# Patient Record
Sex: Female | Born: 1950 | Race: Black or African American | Hispanic: No | Marital: Married | Smoking: Former smoker
Health system: Southern US, Community
[De-identification: ages and names within clinical notes are randomized; demographics above are authoritative.]

## PROBLEM LIST (undated history)

## (undated) DIAGNOSIS — I10 Essential (primary) hypertension: Secondary | ICD-10-CM

## (undated) DIAGNOSIS — E119 Type 2 diabetes mellitus without complications: Secondary | ICD-10-CM

## (undated) HISTORY — PX: ABDOMINAL HYSTERECTOMY: SHX81

---

## 2013-12-28 ENCOUNTER — Other Ambulatory Visit (HOSPITAL_COMMUNITY): Payer: Self-pay | Admitting: Family Medicine

## 2013-12-28 ENCOUNTER — Ambulatory Visit (HOSPITAL_COMMUNITY)
Admission: RE | Admit: 2013-12-28 | Discharge: 2013-12-28 | Disposition: A | Discharge status: Home / Self Care | Payer: Self-pay | Source: Ambulatory Visit | Attending: Family Medicine | Admitting: Family Medicine

## 2013-12-28 DIAGNOSIS — R52 Pain, unspecified: Secondary | ICD-10-CM

## 2017-12-06 ENCOUNTER — Emergency Department (HOSPITAL_BASED_OUTPATIENT_CLINIC_OR_DEPARTMENT_OTHER): Payer: Medicare Other

## 2017-12-06 ENCOUNTER — Encounter (HOSPITAL_BASED_OUTPATIENT_CLINIC_OR_DEPARTMENT_OTHER): Payer: Self-pay | Admitting: Emergency Medicine

## 2017-12-06 ENCOUNTER — Other Ambulatory Visit: Payer: Self-pay

## 2017-12-06 ENCOUNTER — Inpatient Hospital Stay (HOSPITAL_BASED_OUTPATIENT_CLINIC_OR_DEPARTMENT_OTHER)
Admission: EM | Admit: 2017-12-06 | Discharge: 2017-12-08 | DRG: 871 | Disposition: A | Discharge status: Home / Self Care | Payer: Medicare Other | Attending: Family Medicine | Admitting: Family Medicine

## 2017-12-06 DIAGNOSIS — Z7982 Long term (current) use of aspirin: Secondary | ICD-10-CM | POA: Diagnosis not present

## 2017-12-06 DIAGNOSIS — Z87891 Personal history of nicotine dependence: Secondary | ICD-10-CM

## 2017-12-06 DIAGNOSIS — J189 Pneumonia, unspecified organism: Secondary | ICD-10-CM | POA: Diagnosis present

## 2017-12-06 DIAGNOSIS — I10 Essential (primary) hypertension: Secondary | ICD-10-CM | POA: Diagnosis present

## 2017-12-06 DIAGNOSIS — E1169 Type 2 diabetes mellitus with other specified complication: Secondary | ICD-10-CM | POA: Diagnosis present

## 2017-12-06 DIAGNOSIS — E876 Hypokalemia: Secondary | ICD-10-CM | POA: Diagnosis present

## 2017-12-06 DIAGNOSIS — A419 Sepsis, unspecified organism: Secondary | ICD-10-CM | POA: Diagnosis present

## 2017-12-06 DIAGNOSIS — Z7984 Long term (current) use of oral hypoglycemic drugs: Secondary | ICD-10-CM | POA: Diagnosis not present

## 2017-12-06 DIAGNOSIS — Z79899 Other long term (current) drug therapy: Secondary | ICD-10-CM

## 2017-12-06 HISTORY — DX: Type 2 diabetes mellitus without complications: E11.9

## 2017-12-06 LAB — COMPREHENSIVE METABOLIC PANEL
ALBUMIN: 3.4 g/dL — AB (ref 3.5–5.0)
ALT: 12 U/L — ABNORMAL LOW (ref 14–54)
ANION GAP: 11 (ref 5–15)
AST: 16 U/L (ref 15–41)
Alkaline Phosphatase: 96 U/L (ref 38–126)
BUN: 8 mg/dL (ref 6–20)
CHLORIDE: 100 mmol/L — AB (ref 101–111)
CO2: 24 mmol/L (ref 22–32)
Calcium: 8.5 mg/dL — ABNORMAL LOW (ref 8.9–10.3)
Creatinine, Ser: 0.64 mg/dL (ref 0.44–1.00)
GFR calc Af Amer: >60 mL/min (ref >60)
Glucose, Bld: 167 mg/dL — ABNORMAL HIGH (ref 65–99)
POTASSIUM: 2.9 mmol/L — AB (ref 3.5–5.1)
Sodium: 135 mmol/L (ref 135–145)
TOTAL PROTEIN: 7.9 g/dL (ref 6.5–8.1)
Total Bilirubin: 0.3 mg/dL (ref 0.3–1.2)

## 2017-12-06 LAB — CBC WITH DIFFERENTIAL/PLATELET
Basophils Absolute: 0 10*3/uL (ref 0.0–0.1)
Basophils Relative: 0 %
EOS ABS: 0.1 10*3/uL (ref 0.0–0.7)
Eosinophils Relative: 1 %
HEMATOCRIT: 36.4 % (ref 36.0–46.0)
Hemoglobin: 11.8 g/dL — ABNORMAL LOW (ref 12.0–15.0)
LYMPHS ABS: 2.7 10*3/uL (ref 0.7–4.0)
Lymphocytes Relative: 22 %
MCH: 26.6 pg (ref 26.0–34.0)
MCHC: 32.4 g/dL (ref 30.0–36.0)
MCV: 82 fL (ref 78.0–100.0)
MONO ABS: 1.5 10*3/uL — AB (ref 0.1–1.0)
Monocytes Relative: 12 %
Neutro Abs: 8 10*3/uL — ABNORMAL HIGH (ref 1.7–7.7)
Neutrophils Relative %: 65 %
PLATELETS: 418 10*3/uL — AB (ref 150–400)
RBC: 4.44 MIL/uL (ref 3.87–5.11)
RDW: 13.9 % (ref 11.5–15.5)
WBC: 12.3 10*3/uL — ABNORMAL HIGH (ref 4.0–10.5)

## 2017-12-06 LAB — EXPECTORATED SPUTUM ASSESSMENT W REFEX TO RESP CULTURE

## 2017-12-06 LAB — HEMOGLOBIN A1C
Hgb A1c MFr Bld: 8.6 % — ABNORMAL HIGH (ref 4.8–5.6)
MEAN PLASMA GLUCOSE: 200.12 mg/dL

## 2017-12-06 LAB — INFLUENZA PANEL BY PCR (TYPE A & B)
INFLAPCR: NEGATIVE
Influenza B By PCR: NEGATIVE

## 2017-12-06 LAB — GLUCOSE, CAPILLARY: Glucose-Capillary: 169 mg/dL — ABNORMAL HIGH (ref 65–99)

## 2017-12-06 LAB — I-STAT CG4 LACTIC ACID, ED
LACTIC ACID, VENOUS: 1.07 mmol/L (ref 0.5–1.9)
LACTIC ACID, VENOUS: 1.14 mmol/L (ref 0.5–1.9)

## 2017-12-06 LAB — EXPECTORATED SPUTUM ASSESSMENT W GRAM STAIN, RFLX TO RESP C

## 2017-12-06 MED ORDER — INSULIN ASPART 100 UNIT/ML ~~LOC~~ SOLN
0.0000 [IU] | Freq: Three times a day (TID) | SUBCUTANEOUS | Status: DC
  Administered 2017-12-07: 3 [IU] via SUBCUTANEOUS
  Administered 2017-12-07: 2 [IU] via SUBCUTANEOUS
  Administered 2017-12-07 – 2017-12-08 (×2): 5 [IU] via SUBCUTANEOUS
  Administered 2017-12-08: 3 [IU] via SUBCUTANEOUS

## 2017-12-06 MED ORDER — KETOROLAC TROMETHAMINE 15 MG/ML IJ SOLN
15.0000 mg | Freq: Four times a day (QID) | INTRAMUSCULAR | Status: DC | PRN
  Administered 2017-12-06 – 2017-12-08 (×3): 15 mg via INTRAVENOUS
  Filled 2017-12-06 (×3): qty 1

## 2017-12-06 MED ORDER — LOSARTAN POTASSIUM 50 MG PO TABS
100.0000 mg | ORAL_TABLET | Freq: Every day | ORAL | Status: DC
  Administered 2017-12-06 – 2017-12-08 (×3): 100 mg via ORAL
  Filled 2017-12-06 (×3): qty 2

## 2017-12-06 MED ORDER — SODIUM CHLORIDE 0.9 % IV BOLUS
1000.0000 mL | Freq: Once | INTRAVENOUS | Status: AC
  Administered 2017-12-06: 1000 mL via INTRAVENOUS

## 2017-12-06 MED ORDER — ENOXAPARIN SODIUM 40 MG/0.4ML ~~LOC~~ SOLN
40.0000 mg | SUBCUTANEOUS | Status: DC
  Administered 2017-12-06 – 2017-12-07 (×2): 40 mg via SUBCUTANEOUS
  Filled 2017-12-06 (×2): qty 0.4

## 2017-12-06 MED ORDER — SODIUM CHLORIDE 0.9 % IV SOLN
500.0000 mg | INTRAVENOUS | Status: DC
  Administered 2017-12-07 – 2017-12-08 (×2): 500 mg via INTRAVENOUS
  Filled 2017-12-06 (×2): qty 500

## 2017-12-06 MED ORDER — POTASSIUM CHLORIDE CRYS ER 20 MEQ PO TBCR
40.0000 meq | EXTENDED_RELEASE_TABLET | Freq: Once | ORAL | Status: AC
  Administered 2017-12-06: 40 meq via ORAL
  Filled 2017-12-06: qty 2

## 2017-12-06 MED ORDER — POTASSIUM CHLORIDE CRYS ER 20 MEQ PO TBCR
40.0000 meq | EXTENDED_RELEASE_TABLET | Freq: Two times a day (BID) | ORAL | Status: DC
  Administered 2017-12-07 – 2017-12-08 (×3): 40 meq via ORAL
  Filled 2017-12-06 (×3): qty 2

## 2017-12-06 MED ORDER — INSULIN ASPART 100 UNIT/ML ~~LOC~~ SOLN: 0.0000 [IU] | Freq: Every day | SUBCUTANEOUS | Status: DC

## 2017-12-06 MED ORDER — ALBUTEROL SULFATE HFA 108 (90 BASE) MCG/ACT IN AERS
2.0000 | INHALATION_SPRAY | Freq: Once | RESPIRATORY_TRACT | Status: AC
  Administered 2017-12-06: 2 via RESPIRATORY_TRACT
  Filled 2017-12-06: qty 6.7

## 2017-12-06 MED ORDER — HYDRALAZINE HCL 20 MG/ML IJ SOLN: 5.0000 mg | INTRAMUSCULAR | Status: DC | PRN

## 2017-12-06 MED ORDER — SODIUM CHLORIDE 0.9 % IV SOLN
INTRAVENOUS | Status: DC
  Administered 2017-12-06 – 2017-12-07 (×3): via INTRAVENOUS

## 2017-12-06 MED ORDER — IPRATROPIUM-ALBUTEROL 0.5-2.5 (3) MG/3ML IN SOLN
3.0000 mL | Freq: Once | RESPIRATORY_TRACT | Status: AC
  Administered 2017-12-06: 3 mL via RESPIRATORY_TRACT
  Filled 2017-12-06: qty 3

## 2017-12-06 MED ORDER — BENZONATATE 100 MG PO CAPS
100.0000 mg | ORAL_CAPSULE | Freq: Three times a day (TID) | ORAL | Status: DC | PRN
  Administered 2017-12-06 – 2017-12-08 (×4): 100 mg via ORAL
  Filled 2017-12-06 (×4): qty 1

## 2017-12-06 MED ORDER — CEFTRIAXONE SODIUM 1 G IJ SOLR: 1.0000 g | INTRAMUSCULAR | Status: DC

## 2017-12-06 MED ORDER — LEVOFLOXACIN 750 MG PO TABS
750.0000 mg | ORAL_TABLET | Freq: Once | ORAL | Status: AC
  Administered 2017-12-06: 750 mg via ORAL
  Filled 2017-12-06: qty 1

## 2017-12-06 MED ORDER — SODIUM CHLORIDE 0.9 % IV SOLN
1.0000 g | INTRAVENOUS | Status: DC
  Administered 2017-12-06 – 2017-12-07 (×2): 1 g via INTRAVENOUS
  Filled 2017-12-06: qty 10
  Filled 2017-12-06 (×2): qty 1

## 2017-12-06 MED ORDER — ACETAMINOPHEN 650 MG RE SUPP: 650.0000 mg | Freq: Four times a day (QID) | RECTAL | Status: DC | PRN

## 2017-12-06 MED ORDER — ACETAMINOPHEN 325 MG PO TABS: 650.0000 mg | ORAL_TABLET | Freq: Four times a day (QID) | ORAL | Status: DC | PRN

## 2017-12-06 NOTE — ED Provider Notes (Signed)
[facility] EMERGENCY DEPARTMENT Provider Note   CSN: [id number] Arrival date & time: 12/06/17  0913     History   Chief Complaint Chief Complaint  Patient presents with  . Cough    HPI Floretta Petro is a 67 y.o. female.  The history is provided by the patient and medical records. No language interpreter was used.  Cough  This is a new problem. The current episode started more than 1 week ago. The problem occurs constantly. The problem has been rapidly worsening. The cough is productive of sputum. There has been no fever. Associated symptoms include chills, rhinorrhea, shortness of breath and wheezing. Pertinent negatives include no chest pain, no sweats and no headaches. She has tried nothing for the symptoms. The treatment provided no relief. Her past medical history does not include COPD or asthma.    Past Medical History:  Diagnosis Date  . Diabetes mellitus without complication (HCC)     There are no active problems to display for this patient.   Past Surgical History:  Procedure Laterality Date  . ABDOMINAL HYSTERECTOMY       OB History   None      Home Medications    Prior to Admission medications   Medication Sig Start Date End Date Taking? Authorizing Provider  glipiZIDE (GLUCOTROL) 10 MG tablet Take 10 mg by mouth daily before breakfast.   Yes [provider]  metFORMIN (GLUCOPHAGE) 1000 MG tablet Take 1,000 mg by mouth 2 (two) times daily with a meal.   Yes [provider]    Family History History reviewed. No pertinent family history.  Social History Social History   Tobacco Use  . Smoking status: Former Games developer  . Smokeless tobacco: Never Used  Substance Use Topics  . Alcohol use: Never    Frequency: Never  . Drug use: Never     Allergies   Patient has no known allergies.   Review of Systems Review of Systems  Constitutional: Positive for chills and fatigue. Negative for diaphoresis and fever.  HENT:  Positive for congestion and rhinorrhea.   Respiratory: Positive for cough, chest tightness, shortness of breath and wheezing. Negative for stridor.   Cardiovascular: Negative for chest pain and palpitations.  Gastrointestinal: Negative for constipation, diarrhea and nausea.  Genitourinary: Negative for dysuria.  Musculoskeletal: Negative for back pain.  Neurological: Negative for light-headedness and headaches.  Psychiatric/Behavioral: Negative for agitation.  All other systems reviewed and are negative.    Physical Exam Updated Vital Signs BP (!) 169/79 (BP Location: Left Arm)   Pulse 98   Temp 98.7 F (37.1 C) (Oral)   Resp 20   Ht 5\' 5"  (1.651 m)   Wt 81.6 kg (180 lb)   SpO2 92%   BMI 29.95 kg/m   Physical Exam  Constitutional: She is oriented to person, place, and time. She appears well-developed and well-nourished. No distress.  HENT:  Head: Normocephalic.  Nose: Nose normal.  Mouth/Throat: Oropharynx is clear and moist. No oropharyngeal exudate.  Eyes: Pupils are equal, round, and reactive to light. Conjunctivae and EOM are normal.  Neck: Normal range of motion.  Cardiovascular: Intact distal pulses. Tachycardia present.  No murmur heard. Pulmonary/Chest: Tachypnea noted. No respiratory distress. She has wheezes. She has rhonchi. She has no rales.  Abdominal: Soft. She exhibits no distension. There is no tenderness.  Musculoskeletal: She exhibits no edema, tenderness or deformity.  Neurological: She is alert and oriented to person, place, and time. No sensory deficit. She  exhibits normal muscle tone.  Skin: Capillary refill takes less than 2 seconds. No rash noted. She is not diaphoretic. No erythema.  Psychiatric: She has a normal mood and affect.  Nursing note and vitals reviewed.    ED Treatments / Results  Labs (all labs ordered are listed, but only abnormal results are displayed) Labs Reviewed  CBC WITH DIFFERENTIAL/PLATELET - Abnormal; Notable for the  following components:      Result Value   WBC 12.3 (*)    Hemoglobin 11.8 (*)    Platelets 418 (*)    Neutro Abs 8.0 (*)    Monocytes Absolute 1.5 (*)    All other components within normal limits  COMPREHENSIVE METABOLIC PANEL - Abnormal; Notable for the following components:   Potassium 2.9 (*)    Chloride 100 (*)    Glucose, Bld 167 (*)    Calcium 8.5 (*)    Albumin 3.4 (*)    ALT 12 (*)    All other components within normal limits  CULTURE, BLOOD (ROUTINE X 2)  CULTURE, BLOOD (ROUTINE X 2)  HIV ANTIBODY (ROUTINE TESTING)  I-STAT CG4 LACTIC ACID, ED  I-STAT CG4 LACTIC ACID, ED    EKG EKG Interpretation  Date/Time:  Monday December 06 2017 09:55:02 EDT Ventricular Rate:  100 PR Interval:    QRS Duration: 84 QT Interval:  363 QTC Calculation: 469 R Axis:   39 Text Interpretation:  Sinus tachycardia Baseline wander in lead(s) V5 No prior ECG for comparison.  No STEMI Confirmed by Theda Belfastegeler, Chris ([id number]) on 12/06/2017 10:17:22 AM   Radiology Dg Chest 2 View  Result Date: 12/06/2017 CLINICAL DATA:  Short of breath, cough EXAM: CHEST - 2 VIEW COMPARISON:  CT chest 11/16/2004 FINDINGS: Patchy bilateral airspace disease right greater than left, probable pneumonia. No effusion. Heart size normal. Mediastinal structures normal. Negative skeletal structures. IMPRESSION: Patchy bilateral airspace disease most likely bronchopneumonia. Electronically Signed   By: Marlan Palauharles  Clark M.D.   On: 12/06/2017 10:11    Procedures Procedures (including critical care time)  Medications Ordered in ED Medications  enoxaparin (LOVENOX) injection 40 mg (has no administration in time range)  ipratropium-albuterol (DUONEB) 0.5-2.5 (3) MG/3ML nebulizer solution 3 mL (3 mLs Nebulization Given 12/06/17 0945)  albuterol (PROVENTIL HFA;VENTOLIN HFA) 108 (90 Base) MCG/ACT inhaler 2 puff (2 puffs Inhalation Given 12/06/17 1106)  levofloxacin (LEVAQUIN) tablet 750 mg (750 mg Oral Given 12/06/17 1105)  sodium chloride  0.9 % bolus 1,000 mL (0 mLs Intravenous Stopped 12/06/17 1330)  potassium chloride SA (K-DUR,KLOR-CON) CR tablet 40 mEq (40 mEq Oral Given 12/06/17 1349)     Initial Impression / Assessment and Plan / ED Course  I have reviewed the triage vital signs and the nursing notes.  Pertinent labs & imaging results that were available during my care of the patient were reviewed by me and considered in my medical decision making (see chart for details).     Theresia MajorsVivian Diiorio is a 67 y.o. female with a past medical history significant for diabetes who presents with rhinorrhea, cough, congestion, chills, and shortness of breath.  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  She reports that for the last 2 weeks she has had a cough that has become more productive over the last few days.  She reports it was green and now a yellow sputum.  No hemoptysis.  She reports some chills but has had no documented fevers  at home.  She denies nausea vomiting, urinary symptoms or GI symptoms.  She reports that she is having some shortness of breath and chest tightness but denies chest pressure.  She denies any history of pneumonia.    On exam, patient had coarse breath sounds bilaterally.  Patient had very faint wheezes.  No history of asthma or COPD.  Patient was given a breathing treatment with some improvement in the wheezing.  Patient's chest was nontender and abdomen was nontender.  Legs were nonedematous.  Patient was tachycardic on initial evaluation.   Workup revealed evidence of bronchopneumonia as well as a leukocytosis.  Potassium was low and was submitted orally.  Lactic acid not elevated.  EKG showed no evidence of STEMI.   Initially, patient was given oral levofloxacin to treat the pneumonia as she was felt possibly able to be discharged home.  When patient was ambulate, she became more tachycardic and short of breath and did not feel well.  She is  concerned about safely going home to treat her pneumonia as an outpatient.  When patient was resting her oxygen saturations decreased into the 90% range.  Patient was placed on 2 L with significant improvement in her oxygen saturations and breathing.    Based on patient's pneumonia with the oxygen requirement, patient will be admitted to hospital service for further management.  Patient was given potassium for the low potassium.  Hospitalist team was called and patient be admitted for further management.   Final Clinical Impressions(s) / ED Diagnoses   Final diagnoses:  Community acquired pneumonia, unspecified laterality    ED Discharge Orders    None      Clinical Impression: 1. Community acquired pneumonia, unspecified laterality     Disposition: Admit  This note was prepared with assistance of Conservation officer, historic buildings. Occasional wrong-word or sound-a-like substitutions may have occurred due to the inherent limitations of voice recognition software.     Tegeler, Canary Brim, MD 12/06/17 1538

## 2017-12-06 NOTE — Plan of Care (Signed)
67 yo [facility] with cough and sob chest xray cw pneumonia.was tachypneic and tachycardic on arrival.90%on RA.received levofloxacin.k was found to be 2.9.given 80 meq k.needs admission for pneumonia.

## 2017-12-06 NOTE — ED Triage Notes (Signed)
Patient states that she has had an intermittent cough over the last 2 weeks. Headache and Sore throat

## 2017-12-06 NOTE — ED Notes (Signed)
PT ambulated with pulse oximetry. HR 114-118 pt endorses dizziness. Sats 92-95%

## 2017-12-06 NOTE — H&P (Addendum)
History and Physical    Jasmine Frye UJW:[id number] DOB: September 26, 1950 DOA: 12/06/2017  PCP: Anselmo Pickler, MD  Patient coming from: Home  Chief Complaint: Cough  HPI: Jasmine Frye is a 67 y.o. female with medical history significant of DM presents with worsening productive cough and sob over the past several days prior to admit. 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 Over past several days, patient reported increased cough, seemed to be worse at night, wakening pt's husband. Cough later became productive of thick mucus. Patient then noted increased SOB and subsequently presented to [facility] ED   ED Course: In the Ed, patient noted to be tachycardic with WBC of over 12k and increased tachypnea. Patient has been continued on [facility]. CXR was notable for patchy bilateral airspace disease. Patient was given one dose of levaquin. Hospitalist consulted for consideration for admission. Of note, since receiving one dose of abx, patient has noted feeling somewhat improved.  Review of Systems:  Review of Systems  Constitutional: Negative for chills, malaise/fatigue and weight loss.  HENT: Negative for ear discharge, ear pain, nosebleeds and tinnitus.   Eyes: Negative for double vision, photophobia and pain.  Respiratory: Positive for cough, sputum production and shortness of breath. Negative for stridor.   Cardiovascular: Negative for orthopnea, claudication and leg swelling.  Gastrointestinal: Negative for abdominal pain, constipation, nausea and vomiting.  Genitourinary: Negative for frequency, hematuria and urgency.  Musculoskeletal: Negative for back pain, joint pain and neck pain.  Neurological: Negative for tingling, tremors, seizures, loss of consciousness and headaches.  Psychiatric/Behavioral: Negative for hallucinations. The patient is not nervous/anxious and does not have insomnia.     Past Medical History:  Diagnosis Date  . Diabetes mellitus  without complication [facility])     Past Surgical History:  Procedure Laterality Date  . ABDOMINAL HYSTERECTOMY       reports that she has quit smoking. She has never used smokeless tobacco. She reports that she does not drink alcohol or use drugs.  No Known Allergies  Family history: Multiple members with diabetes Mother deceased from heart disease Multiple members with HTN  Prior to Admission medications   Medication Sig Start Date End Date Taking? Authorizing Provider  Aspirin-Acetaminophen (GOODYS BODY PAIN PO) Take 1 packet by mouth 2 (two) times daily.   Yes [provider]  Camphor-Eucalyptus-Menthol (VICKS VAPORUB) 4.7-1.2-2.6 % OINT Apply topically.   Yes [provider]  Chlorphen-Phenyleph-ASA (ALKA-SELTZER PLUS COLD PO) Take 1 tablet by mouth daily as needed.   Yes [provider]  diphenhydrAMINE (BENADRYL) 25 mg capsule Take 25 mg by mouth every 6 (six) hours as needed.   Yes [provider]  glipiZIDE (GLUCOTROL) 10 MG tablet Take 10 mg by mouth daily before breakfast.   Yes [provider]  Phenylephrine-DM-GG-APAP (MUCINEX FAST-MAX COLD FLU) 5-10-200-325 MG/10ML LIQD Take 10 mLs by mouth 2 (two) times daily as needed.   Yes [provider]  pioglitazone (ACTOS) 30 MG tablet Take 30 mg by mouth daily. 10/30/17  Yes [provider]  sitaGLIPtin-metformin (JANUMET) 50-1000 MG tablet Take 1 tablet by mouth 2 (two) times daily with a meal.   Yes [provider]  losartan (COZAAR) 100 MG tablet Take 100 mg by mouth daily.    [provider]    Physical Exam: Vitals:   12/06/17 1337 12/06/17 1545 12/06/17 1619 12/06/17 1726  BP: (!) 142/97  (!) 157/72 (!) 163/75  Pulse:  100 99 (!) 101  Resp:  (!) 25 20 20   Temp:   98.4 F (36.9 C) 99.5 F (37.5 C)  TempSrc:   Oral Oral  SpO2:  98% 96% 99%  Weight:    82.5 kg (181 lb 14.1 oz)  Height:    5' 5.5" (1.664 m)    Constitutional: NAD, calm,  comfortable Vitals:   12/06/17 1337 12/06/17 1545 12/06/17 1619 12/06/17 1726  BP: (!) 142/97  (!) 157/72 (!) 163/75  Pulse:  100 99 (!) 101  Resp:  (!) 25 20 20   Temp:   98.4 F (36.9 C) 99.5 F (37.5 C)  TempSrc:   Oral Oral  SpO2:  98% 96% 99%  Weight:    82.5 kg (181 lb 14.1 oz)  Height:    5' 5.5" (1.664 m)   Eyes: PERRL, lids and conjunctivae normal ENMT: Mucous membranes are moist. Posterior pharynx clear of any exudate or lesions.Normal dentition.  Neck: normal, supple, no masses, no thyromegaly Respiratory: clear to auscultation bilaterally, no wheezing, normal resp effort Cardiovascular: Regular rate and rhythm, s1, s2 Abdomen: no tenderness, no masses palpated. No hepatosplenomegaly. Bowel sounds positive.  Musculoskeletal: no clubbing / cyanosis. No joint deformity upper and lower extremities. Good ROM, no contractures. Normal muscle tone.  Skin: no rashes, lesions, ulcers. No induration Neurologic: CN 2-12 grossly intact. strength 5/5 in all 4.  Psychiatric: Normal judgment and insight. Alert and oriented x 3. Normal mood.    Labs on Admission: I have personally reviewed following labs and imaging studies  CBC: Recent Labs  Lab 12/06/17 1221  WBC 12.3*  NEUTROABS 8.0*  HGB 11.8*  HCT 36.4  MCV 82.0  PLT 418*   Basic Metabolic Panel: Recent Labs  Lab 12/06/17 1221  NA 135  K 2.9*  CL 100*  CO2 24  GLUCOSE 167*  BUN 8  CREATININE 0.64  CALCIUM 8.5*   GFR: Estimated Creatinine Clearance: 75.1 mL/min (by C-G formula based on SCr of 0.64 mg/dL). Liver Function Tests: Recent Labs  Lab 12/06/17 1221  AST 16  ALT 12*  ALKPHOS 96  BILITOT 0.3  PROT 7.9  ALBUMIN 3.4*   No results for input(s): LIPASE, AMYLASE in the last 168 hours. No results for input(s): AMMONIA in the last 168 hours. Coagulation Profile: No results for input(s): INR, PROTIME in the last 168 hours. Cardiac Enzymes: No results for input(s): CKTOTAL, CKMB, CKMBINDEX, TROPONINI  in the last 168 hours. BNP (last 3 results) No results for input(s): PROBNP in the last 8760 hours. HbA1C: No results for input(s): HGBA1C in the last 72 hours. CBG: No results for input(s): GLUCAP in the last 168 hours. Lipid Profile: No results for input(s): CHOL, HDL, LDLCALC, TRIG, CHOLHDL, LDLDIRECT in the last 72 hours. Thyroid Function Tests: No results for input(s): TSH, T4TOTAL, FREET4, T3FREE, THYROIDAB in the last 72 hours. Anemia Panel: No results for input(s): VITAMINB12, FOLATE, FERRITIN, TIBC, IRON, RETICCTPCT in the last 72 hours. Urine analysis: No results found for: COLORURINE, APPEARANCEUR, LABSPEC, PHURINE, GLUCOSEU, HGBUR, BILIRUBINUR, KETONESUR, PROTEINUR, UROBILINOGEN, NITRITE, LEUKOCYTESUR Sepsis Labs: !!!!!!!!!!!!!!!!!!!!!!!!!!!!!!!!!!!!!!!!!!!! @LABRCNTIP (procalcitonin:4,lacticidven:4) )No results found for this or any previous visit (from the past 240 hour(s)).   Radiological Exams on Admission: Dg Chest 2 View  Result Date: 12/06/2017 CLINICAL DATA:  Short of breath, cough EXAM: CHEST - 2 VIEW COMPARISON:  CT chest 11/16/2004 FINDINGS: Patchy bilateral airspace disease right greater than left, probable pneumonia. No effusion. Heart size normal. Mediastinal structures normal. Negative skeletal structures. IMPRESSION: Patchy bilateral airspace disease most likely  bronchopneumonia. Electronically Signed   By: Marlan Palauharles  Clark M.D.   On: 12/06/2017 10:11    EKG: Independently reviewed. Sinus tach  Assessment/Plan Principal Problem:   Sepsis due to pneumonia [facility](HCC) Active Problems:   Hypokalemia   Type 2 diabetes mellitus with other specified complication (HCC)   1. Sepsis with pneumonia 1. Pt presents with tachycardia, tachypnea, leukocytosis in the setting of PNA on CXR. 2. Sepsis protocol initiated 3. Given one dose of levofloxacin. Will continue azithro with rocephin 4. Follow blood and sputum cultures 5. Will check flu swab given multiple sick  contacts 6. Repeat CBC and bmet in AM 2. DM2 1. Random glucose stable 2. Will hold oral diabetic medications 3. Continue on SSI coverage 4. Will check A1c 3. Hypokalemia 1. 40meq given once in ED for K of 2.9 2. Normal renal function 3. Will give additional 40meq later for total of 80meq 4. Repeat bmet in AM. 4. HTN 1. BP currently suboptimally controlled 2. Resume home ARB as tolerated 3. Will order PRN hydralazine for sbp>160  DVT prophylaxis: Lovenox subq  Code Status: Full Family Communication: Pt in room, family not at bedside  Disposition Plan: Possible home in 48-72hrs  Consults called:  Admission status: Inpatient as would likely require greater than 2 midnight stay to resolve sepsis and hypoxemia in setting of sepsis with pneumonia   Rickey BarbaraStephen Chiu MD [facility] Pager [phone]  If 7PM-7AM, please contact night-coverage [email or web address] Password [email or web address]  12/06/2017, 5:54 PM

## 2017-12-06 NOTE — Plan of Care (Signed)
  Problem: Pain Managment: Goal: General experience of comfort will improve Outcome: Progressing   Problem: Skin Integrity: Goal: Risk for impaired skin integrity will decrease Outcome: Progressing   Problem: Activity: Goal: Ability to tolerate increased activity will improve Outcome: Progressing   Problem: Clinical Measurements: Goal: Ability to maintain a body temperature in the normal range will improve Outcome: Progressing   Problem: Respiratory: Goal: Ability to maintain adequate ventilation will improve Outcome: Progressing Goal: Ability to maintain a clear airway will improve Outcome: Progressing   

## 2017-12-06 NOTE — ED Notes (Addendum)
Patient placed on 2L via [place] per MD due to oxygen saturation of 91% on RA. Oxygen saturation 97% on 2L [place]

## 2017-12-07 LAB — COMPREHENSIVE METABOLIC PANEL
ALBUMIN: 2.8 g/dL — AB (ref 3.5–5.0)
ALT: 12 U/L — ABNORMAL LOW (ref 14–54)
AST: 19 U/L (ref 15–41)
Alkaline Phosphatase: 89 U/L (ref 38–126)
Anion gap: 7 (ref 5–15)
BUN: 5 mg/dL — ABNORMAL LOW (ref 6–20)
CHLORIDE: 103 mmol/L (ref 101–111)
CO2: 25 mmol/L (ref 22–32)
Calcium: 8.3 mg/dL — ABNORMAL LOW (ref 8.9–10.3)
Creatinine, Ser: 0.59 mg/dL (ref 0.44–1.00)
GFR calc Af Amer: >60 mL/min (ref >60)
GFR calc non Af Amer: >60 mL/min (ref >60)
GLUCOSE: 184 mg/dL — AB (ref 65–99)
POTASSIUM: 4.6 mmol/L (ref 3.5–5.1)
SODIUM: 135 mmol/L (ref 135–145)
Total Bilirubin: 0.8 mg/dL (ref 0.3–1.2)
Total Protein: 7 g/dL (ref 6.5–8.1)

## 2017-12-07 LAB — GLUCOSE, CAPILLARY
GLUCOSE-CAPILLARY: 159 mg/dL — AB (ref 65–99)
Glucose-Capillary: 231 mg/dL — ABNORMAL HIGH (ref 65–99)
Glucose-Capillary: 150 mg/dL — ABNORMAL HIGH (ref 65–99)
Glucose-Capillary: 189 mg/dL — ABNORMAL HIGH (ref 65–99)

## 2017-12-07 LAB — CBC
HCT: 35.2 % — ABNORMAL LOW (ref 36.0–46.0)
Hemoglobin: 11.4 g/dL — ABNORMAL LOW (ref 12.0–15.0)
MCH: 27.3 pg (ref 26.0–34.0)
MCHC: 32.4 g/dL (ref 30.0–36.0)
MCV: 84.4 fL (ref 78.0–100.0)
Platelets: 421 10*3/uL — ABNORMAL HIGH (ref 150–400)
RBC: 4.17 MIL/uL (ref 3.87–5.11)
RDW: 14 % (ref 11.5–15.5)
WBC: 10.6 10*3/uL — ABNORMAL HIGH (ref 4.0–10.5)

## 2017-12-07 LAB — STREP PNEUMONIAE URINARY ANTIGEN: Strep Pneumo Urinary Antigen: NEGATIVE

## 2017-12-07 LAB — HIV ANTIBODY (ROUTINE TESTING W REFLEX): HIV Screen 4th Generation wRfx: NONREACTIVE

## 2017-12-07 MED ORDER — HYDROCOD POLST-CPM POLST ER 10-8 MG/5ML PO SUER
5.0000 mL | Freq: Two times a day (BID) | ORAL | Status: DC | PRN
  Administered 2017-12-07: 5 mL via ORAL
  Filled 2017-12-07: qty 5

## 2017-12-07 MED ORDER — HYDROCOD POLST-CPM POLST ER 10-8 MG/5ML PO SUER: 5.0000 mL | Freq: Two times a day (BID) | ORAL | Status: DC | PRN

## 2017-12-07 NOTE — Plan of Care (Signed)
  Problem: Pain Managment: Goal: General experience of comfort will improve Outcome: Progressing   Problem: Skin Integrity: Goal: Risk for impaired skin integrity will decrease Outcome: Progressing   Problem: Activity: Goal: Ability to tolerate increased activity will improve Outcome: Progressing   Problem: Clinical Measurements: Goal: Ability to maintain a body temperature in the normal range will improve Outcome: Progressing   Problem: Respiratory: Goal: Ability to maintain adequate ventilation will improve Outcome: Progressing Goal: Ability to maintain a clear airway will improve Outcome: Progressing

## 2017-12-07 NOTE — Progress Notes (Signed)
PROGRESS NOTE    Jasmine Frye  UEA:[id number] DOB: [birth year] DOA: 12/06/2017 PCP: Anselmo Pickler, MD    Brief Narrative:  67 y.o. female with medical history significant of DM presents with worsening productive cough and sob over the past several days prior to admit. 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 Over past several days, patient reported increased cough, seemed to be worse at night, wakening pt's husband. Cough later became productive of thick mucus. Patient then noted increased SOB and subsequently presented to [facility] ED   In ED, patient was found to be septic with patchy bilateral airspace disease. Patient was admitted for further work up.  Assessment & Plan:   Principal Problem:   Sepsis due to pneumonia [facility]) Active Problems:   Hypokalemia   Type 2 diabetes mellitus with other specified complication (HCC)  1. Sepsis with pneumonia 1. Pt presents with tachycardia, tachypnea, leukocytosis in the setting of PNA on CXR. 2. Sepsis protocol initiated 3. Given one dose of levofloxacin in the ED and has been continued on azithro with rocephin 4. Patient reporting improvement, however still with sob and increased cough 5. WBC improving. Patient afebrile. No longer septic 6. Will give trial of flutter valve 7. Repeat CBC in AM 2. DM2 1. Random glucose stable 2. Will hold oral diabetic medications while in hospital 3. Continue on SSI coverage 4. A1c of 8.6 3. Hypokalemia 1. given once in ED for K of 2.9 with additional given 2. Normal renal function 3. Potassium corrected 4. Repeat bmet in AM 4. HTN 1. BP currently suboptimally controlled 2. Have resumed home ARB as tolerated 3. Continue PRN hydralazine for sbp>160   DVT prophylaxis: Lovenox subQ Code Status: Full Family Communication: Pt in room, family not at bedside Disposition Plan: Uncertain at this time  Consultants:     Procedures:      Antimicrobials: Anti-infectives (From admission, onward)   Start     Dose/Rate Route Frequency Ordered Stop   12/07/17 1000  cefTRIAXone (ROCEPHIN) 1 g in sodium chloride 0.9 % 100 mL IVPB  Status:  Discontinued     1 g 200 mL/hr over 30 Minutes Intravenous Every 24 hours 12/06/17 1748 12/06/17 1826   12/07/17 1000  azithromycin (ZITHROMAX) 500 mg in sodium chloride 0.9 % 250 mL IVPB     500 mg 250 mL/hr over 60 Minutes Intravenous Every 24 hours 12/06/17 1748 12/14/17 0959   12/06/17 2000  cefTRIAXone (ROCEPHIN) 1 g in sodium chloride 0.9 % 100 mL IVPB     1 g 200 mL/hr over 30 Minutes Intravenous Every 24 hours 12/06/17 1826 12/13/17 1959   12/06/17 1100  levofloxacin (LEVAQUIN) tablet 750 mg     750 mg Oral  Once 12/06/17 1045 12/06/17 1105       Subjective: Feels better, however still sob and with increased productive cough  Objective: Vitals:   12/06/17 1619 12/06/17 1726 12/06/17 2206 12/07/17 0617  BP: (!) 157/72 (!) 163/75 (!) 163/74 (!) 168/86  Pulse: 99 (!) 101 87 85  Resp: 20 20 18 16   Temp: 98.4 F (36.9 C) 99.5 F (37.5 C) 98.8 F (37.1 C) 97.7 F (36.5 C)  TempSrc: Oral Oral Oral Oral  SpO2: 96% 99% 97% 98%  Weight:  82.5 kg (181 lb 14.1 oz)    Height:  5' 5.5" (1.664 m)      Intake/Output Summary (Last 24 hours) at 12/07/2017 1424 Last data filed at 12/07/2017 4782  Gross per 24 hour  Intake 1685 ml  Output 2300 ml  Net -615 ml   Filed Weights   12/06/17 0928 12/06/17 1726  Weight: 81.6 kg (180 lb) 82.5 kg (181 lb 14.1 oz)    Examination:  General exam: Appears calm and comfortable  Respiratory system: Coarse breath sounds on auscultation. slighly increased resp effort Cardiovascular system: S1 & S2 heard, regular Gastrointestinal system: Abdomen is nondistended, soft and nontender. No organomegaly or masses felt. Normal bowel sounds heard. Central nervous system: Alert and oriented. No focal neurological deficits. Extremities: Symmetric 5 x  5 power. Skin: No rashes, lesions  Psychiatry: Judgement and insight appear normal. Mood & affect appropriate.   Data Reviewed: I have personally reviewed following labs and imaging studies  CBC: Recent Labs  Lab 12/06/17 1221 12/07/17 0643  WBC 12.3* 10.6*  NEUTROABS 8.0*  --   HGB 11.8* 11.4*  HCT 36.4 35.2*  MCV 82.0 84.4  PLT 418* 421*   Basic Metabolic Panel: Recent Labs  Lab 12/06/17 1221 12/07/17 0550  NA 135 135  K 2.9* 4.6  CL 100* 103  CO2 24 25  GLUCOSE 167* 184*  BUN 8 5*  CREATININE 0.64 0.59  CALCIUM 8.5* 8.3*   GFR: Estimated Creatinine Clearance: 75.1 mL/min (by C-G formula based on SCr of 0.59 mg/dL). Liver Function Tests: Recent Labs  Lab 12/06/17 1221 12/07/17 0550  AST 16 19  ALT 12* 12*  ALKPHOS 96 89  BILITOT 0.3 0.8  PROT 7.9 7.0  ALBUMIN 3.4* 2.8*   No results for input(s): LIPASE, AMYLASE in the last 168 hours. No results for input(s): AMMONIA in the last 168 hours. Coagulation Profile: No results for input(s): INR, PROTIME in the last 168 hours. Cardiac Enzymes: No results for input(s): CKTOTAL, CKMB, CKMBINDEX, TROPONINI in the last 168 hours. BNP (last 3 results) No results for input(s): PROBNP in the last 8760 hours. HbA1C: Recent Labs    12/06/17 1845  HGBA1C 8.6*   CBG: Recent Labs  Lab 12/06/17 2200 12/07/17 0724 12/07/17 1147  GLUCAP 169* 231* 150*   Lipid Profile: No results for input(s): CHOL, HDL, LDLCALC, TRIG, CHOLHDL, LDLDIRECT in the last 72 hours. Thyroid Function Tests: No results for input(s): TSH, T4TOTAL, FREET4, T3FREE, THYROIDAB in the last 72 hours. Anemia Panel: No results for input(s): VITAMINB12, FOLATE, FERRITIN, TIBC, IRON, RETICCTPCT in the last 72 hours. Sepsis Labs: Recent Labs  Lab 12/06/17 1220 12/06/17 1515  LATICACIDVEN 1.07 1.14    Recent Results (from the past 240 hour(s))  Blood culture (routine x 2)     Status: None (Preliminary result)   Collection Time: 12/06/17 12:15  PM  Result Value Ref Range Status   Specimen Description   Final    BLOOD BLOOD RIGHT FOREARM Performed at [facility] [place], [address] [place], [place] [address]    Special Requests   Final    BOTTLES DRAWN AEROBIC AND ANAEROBIC Blood Culture adequate volume Performed at [facility] [place], [address] [place], [place] [address]    Culture   Final    NO GROWTH < 24 HOURS Performed at [facility], [address] [place], [place] [address]    Report Status PENDING  Incomplete  Blood culture (routine x 2)     Status: None (Preliminary result)   Collection Time: 12/06/17 12:19 PM  Result Value Ref Range Status   Specimen Description   Final    BLOOD BLOOD RIGHT  HAND Performed at [facility], [address] [place], [place] [address]    Special Requests   Final    BOTTLES DRAWN AEROBIC AND ANAEROBIC Blood Culture adequate volume Performed at [facility], [address] [place], [place] [address]    Culture   Final    NO GROWTH < 24 HOURS Performed at [facility], [address], [place], [place] [address]    Report Status PENDING  Incomplete  Culture, sputum-assessment     Status: None   Collection Time: 12/06/17  9:12 PM  Result Value Ref Range Status   Specimen Description SPU  Final   Special Requests NONE  Final   Sputum evaluation   Final    THIS SPECIMEN IS ACCEPTABLE FOR SPUTUM CULTURE Performed at [facility], [address] [place], [place] [address]    Report Status 12/06/2017 FINAL  Final     Radiology Studies: Dg Chest 2 View  Result Date: 12/06/2017 CLINICAL DATA:  Short of breath, cough EXAM: CHEST - 2 VIEW COMPARISON:  CT chest 11/16/2004 FINDINGS: Patchy bilateral airspace disease right greater than left, probable pneumonia. No effusion. Heart size normal. Mediastinal structures normal. Negative skeletal structures. IMPRESSION: Patchy bilateral airspace disease most likely  bronchopneumonia. Electronically Signed   By: Marlan Palau M.D.   On: 12/06/2017 10:11    Scheduled Meds: . enoxaparin (LOVENOX) injection  40 mg Subcutaneous Q24H  . insulin aspart  0-15 Units Subcutaneous TID WC  . insulin aspart  0-5 Units Subcutaneous QHS  . losartan  100 mg Oral Daily  . potassium chloride  40 mEq Oral BID   Continuous Infusions: . sodium chloride 75 mL/hr at 12/07/17 0740  . azithromycin Stopped (12/07/17 1037)  . cefTRIAXone (ROCEPHIN)  IV Stopped (12/06/17 2134)     LOS: 1 day   Rickey Barbara, MD [facility] Pager [phone]  If 7PM-7AM, please contact night-coverage [email or web address] Password [facility] 12/07/2017, 2:24 PM

## 2017-12-07 NOTE — Care Management Note (Signed)
Case Management Note  Patient Details  Name: Jasmine Frye MRN: [id number] Date of Birth: 10/23/1950  Subjective/Objective: Admitted w/Sepsis. From home.                   Action/Plan:d/c plan home,   Expected Discharge Date:                  Expected Discharge Plan:  Home/Self Care  In-House Referral:     Discharge planning Services  CM Consult  Post Acute Care Choice:    Choice offered to:     DME Arranged:    DME Agency:     HH Arranged:    HH Agency:     Status of Service:  In process, will continue to follow  If discussed at Long Length of Stay Meetings, dates discussed:    Additional Comments:  Lanier ClamMahabir, Makaveli Hoard, RN 12/07/2017, 1:33 PM

## 2017-12-08 DIAGNOSIS — E876 Hypokalemia: Secondary | ICD-10-CM

## 2017-12-08 DIAGNOSIS — A419 Sepsis, unspecified organism: Principal | ICD-10-CM

## 2017-12-08 DIAGNOSIS — J189 Pneumonia, unspecified organism: Secondary | ICD-10-CM

## 2017-12-08 DIAGNOSIS — E1169 Type 2 diabetes mellitus with other specified complication: Secondary | ICD-10-CM

## 2017-12-08 LAB — BASIC METABOLIC PANEL
ANION GAP: 11 (ref 5–15)
BUN: 7 mg/dL (ref 6–20)
CO2: 24 mmol/L (ref 22–32)
Calcium: 8.8 mg/dL — ABNORMAL LOW (ref 8.9–10.3)
Chloride: 100 mmol/L — ABNORMAL LOW (ref 101–111)
Creatinine, Ser: 0.58 mg/dL (ref 0.44–1.00)
Glucose, Bld: 209 mg/dL — ABNORMAL HIGH (ref 65–99)
POTASSIUM: 4.3 mmol/L (ref 3.5–5.1)
Sodium: 135 mmol/L (ref 135–145)

## 2017-12-08 LAB — CBC
HEMATOCRIT: 37.4 % (ref 36.0–46.0)
HEMOGLOBIN: 11.8 g/dL — AB (ref 12.0–15.0)
MCH: 26.7 pg (ref 26.0–34.0)
MCHC: 31.6 g/dL (ref 30.0–36.0)
MCV: 84.6 fL (ref 78.0–100.0)
Platelets: 483 10*3/uL — ABNORMAL HIGH (ref 150–400)
RBC: 4.42 MIL/uL (ref 3.87–5.11)
RDW: 13.9 % (ref 11.5–15.5)
WBC: 10.6 10*3/uL — ABNORMAL HIGH (ref 4.0–10.5)

## 2017-12-08 LAB — GLUCOSE, CAPILLARY
GLUCOSE-CAPILLARY: 211 mg/dL — AB (ref 65–99)
GLUCOSE-CAPILLARY: 179 mg/dL — AB (ref 65–99)

## 2017-12-08 MED ORDER — HYDROCOD POLST-CPM POLST ER 10-8 MG/5ML PO SUER: 5.0000 mL | Freq: Two times a day (BID) | ORAL | 0 refills | Status: AC | PRN

## 2017-12-08 MED ORDER — PREDNISONE 20 MG PO TABS: 40.0000 mg | ORAL_TABLET | Freq: Every day | ORAL | 0 refills | Status: DC

## 2017-12-08 MED ORDER — CEFPODOXIME PROXETIL 200 MG PO TABS
200.0000 mg | ORAL_TABLET | Freq: Two times a day (BID) | ORAL | 0 refills | Status: AC
Start: 2017-12-08 — End: 2017-12-12

## 2017-12-08 MED ORDER — SENNOSIDES-DOCUSATE SODIUM 8.6-50 MG PO TABS: 1.0000 | ORAL_TABLET | Freq: Every day | ORAL | Status: DC

## 2017-12-08 NOTE — Discharge Summary (Signed)
Physician Discharge Summary  Leita Lindbloom  JXB:[id number]  DOB: [birth year]  DOA: 12/06/2017 PCP: Anselmo Pickler, MD  Admit date: 12/06/2017 Discharge date: 12/08/2017  Admitted From: Home  Disposition: Home  Recommendations for Outpatient Follow-up:  1. Follow up with PCP in 1 week 2. Please obtain BMP/CBC in one week to monitor renal function and white count 3. Repeat chest x-ray in 6 weeks to monitor resolution of pneumonia  Discharge Condition: Stable CODE STATUS: Full code Diet recommendation: Heart Healthy / Carb Modified   Brief/Interim Summary: For full details see H&P/Progress note, but in brief, Jasmine Frye is a 67 year old female with medical history significant of diabetes mellitus presented to the emergency department with shortness of breath and cough.  Upon ED evaluation she was found to be septic with chest x-ray showing patchy bilateral airspace disease patient was admitted for working diagnosis of pneumonia.    Subjective: Patient seen and examined, she reported that her breathing has significantly improved.  Cough much better.  Moving around well without shortness of breath.  Tolerating diet well.  Remains afebrile.  Discharge Diagnoses/Hospital Course:  Sepsis secondary to pneumonia (CAP) Sepsis physiology has resolved She initially was treated with Levaquin then switched to azithromycin and Rocephin.  She completed 3 days of azithromycin, will complete total 7 days of antibiotics with Vantin. She was treated with flutter valve, cough has improved but on exam still not moving air well, will discharge on prednisone 40 mg x 3 days.  Follow-up with PCP in 1 week. Blood cultures no growth, influenza PCR negative, strep pneumo antigen negative.  Diabetes mellitus type 2 CBGs were stable during hospital stay Resume home medication A1c 8.6 Follow-up with PCP  Hypokalemia Resolved  Hypertension BP acceptable due to hospital stay Continue home medications with  no changes Follow-up with PCP  On the day of the discharge the patient's vitals were stable, and no other acute medical condition were reported by patient. the patient was felt safe to be discharge to home.   Discharge Instructions  You were cared for by a hospitalist during your hospital stay. If you have any questions about your discharge medications or the care you received while you were in the hospital after you are discharged, you can call the unit and asked to speak with the hospitalist on call if the hospitalist that took care of you is not available. Once you are discharged, your primary care physician will handle any further medical issues. Please note that NO REFILLS for any discharge medications will be authorized once you are discharged, as it is imperative that you return to your primary care physician (or establish a relationship with a primary care physician if you do not have one) for your aftercare needs so that they can reassess your need for medications and monitor your lab values.  Discharge Instructions    Call MD for:  difficulty breathing, headache or visual disturbances   Complete by:  As directed    Call MD for:  extreme fatigue   Complete by:  As directed    Call MD for:  hives   Complete by:  As directed    Call MD for:  persistant dizziness or light-headedness   Complete by:  As directed    Call MD for:  persistant nausea and vomiting   Complete by:  As directed    Call MD for:  redness, tenderness, or signs of infection (pain, swelling, redness, odor or green/yellow discharge around incision site)   Complete  by:  As directed    Call MD for:  severe uncontrolled pain   Complete by:  As directed    Call MD for:  temperature >100.4   Complete by:  As directed    Diet - low sodium heart healthy   Complete by:  As directed    Increase activity slowly   Complete by:  As directed      Allergies as of 12/08/2017   No Known Allergies     Medication List     STOP taking these medications   diphenhydrAMINE 25 mg capsule Commonly known as:  BENADRYL   GOODYS BODY PAIN PO   MUCINEX FAST-MAX COLD FLU 5-10-200-325 MG/10ML Liqd Generic drug:  Phenylephrine-DM-GG-APAP     TAKE these medications   ALKA-SELTZER PLUS COLD PO Take 1 tablet by mouth daily as needed.   cefpodoxime 200 MG tablet Commonly known as:  VANTIN Take 1 tablet (200 mg total) by mouth 2 (two) times daily for 4 days.   chlorpheniramine-HYDROcodone 10-8 MG/5ML Suer Commonly known as:  TUSSIONEX Take 5 mLs by mouth every 12 (twelve) hours as needed for up to 3 days for cough.   glipiZIDE 10 MG tablet Commonly known as:  GLUCOTROL Take 10 mg by mouth daily before breakfast.   losartan 100 MG tablet Commonly known as:  COZAAR Take 100 mg by mouth daily.   pioglitazone 30 MG tablet Commonly known as:  ACTOS Take 30 mg by mouth daily.   predniSONE 20 MG tablet Commonly known as:  DELTASONE Take 2 tablets (40 mg total) by mouth daily with breakfast.   sitaGLIPtin-metformin 50-1000 MG tablet Commonly known as:  JANUMET Take 1 tablet by mouth 2 (two) times daily with a meal.   VICKS VAPORUB 4.7-1.2-2.6 % Oint Apply topically.      Follow-up Information    Achreja, Youlanda Mighty, MD. Schedule an appointment as soon as possible for a visit in 1 week(s).   Specialty:  Family Medicine Why:  Hospital follow-up Contact information: [address] [place] [address] [phone]          No Known Allergies  Consultations:   Procedures/Studies: Dg Chest 2 View  Result Date: 12/06/2017 CLINICAL DATA:  Short of breath, cough EXAM: CHEST - 2 VIEW COMPARISON:  CT chest 11/16/2004 FINDINGS: Patchy bilateral airspace disease right greater than left, probable pneumonia. No effusion. Heart size normal. Mediastinal structures normal. Negative skeletal structures. IMPRESSION: Patchy bilateral airspace disease most likely bronchopneumonia. Electronically Signed   By:  Marlan Palau M.D.   On: 12/06/2017 10:11     Discharge Exam: Vitals:   12/07/17 2155 12/08/17 0446  BP: (!) 160/78 (!) 149/73  Pulse: 85 97  Resp: 16 16  Temp: 98.8 F (37.1 C) 98.4 F (36.9 C)  SpO2: 98% 98%   Vitals:   12/07/17 0617 12/07/17 1535 12/07/17 2155 12/08/17 0446  BP: (!) 168/86 130/69 (!) 160/78 (!) 149/73  Pulse: 85 79 85 97  Resp: 16 17 16 16   Temp: 97.7 F (36.5 C) 99.4 F (37.4 C) 98.8 F (37.1 C) 98.4 F (36.9 C)  TempSrc: Oral Oral Oral Oral  SpO2: 98% 98% 98% 98%  Weight:      Height:        General: Pt is alert, awake, not in acute distress Cardiovascular: RRR, S1/S2 +, no rubs, no gallops Respiratory: Diffuse rhonchi, good air entry. Mild expiratory wheezing  Abdominal: Soft, NT, ND, bowel sounds + Extremities: no edema  The results of significant diagnostics from this hospitalization (including imaging, microbiology, ancillary and laboratory) are listed below for reference.     Microbiology: Recent Results (from the past 240 hour(s))  Blood culture (routine x 2)     Status: None (Preliminary result)   Collection Time: 12/06/17 12:15 PM  Result Value Ref Range Status   Specimen Description   Final    BLOOD BLOOD RIGHT FOREARM Performed at [facility], [address] [place], [place] [address]    Special Requests   Final    BOTTLES DRAWN AEROBIC AND ANAEROBIC Blood Culture adequate volume Performed at [facility], [address] [place], [place] [address]    Culture   Final    NO GROWTH 2 DAYS Performed at [facility], [address] [place], [place] [address]    Report Status PENDING  Incomplete  Blood culture (routine x 2)     Status: None (Preliminary result)   Collection Time: 12/06/17 12:19 PM  Result Value Ref Range Status   Specimen Description   Final    BLOOD BLOOD RIGHT HAND Performed at [facility], [address] [place], [place] [address]    Special Requests   Final     BOTTLES DRAWN AEROBIC AND ANAEROBIC Blood Culture adequate volume Performed at [facility], [address] [place], [place] [address]    Culture   Final    NO GROWTH 2 DAYS Performed at [facility], [address] [place], [place] [address]    Report Status PENDING  Incomplete  Culture, sputum-assessment     Status: None   Collection Time: 12/06/17  9:12 PM  Result Value Ref Range Status   Specimen Description SPU  Final   Special Requests NONE  Final   Sputum evaluation   Final    THIS SPECIMEN IS ACCEPTABLE FOR SPUTUM CULTURE Performed at [facility], [address], [place], [place] [address]    Report Status 12/06/2017 FINAL  Final  Culture, respiratory (NON-Expectorated)     Status: None (Preliminary result)   Collection Time: 12/06/17  9:12 PM  Result Value Ref Range Status   Specimen Description   Final    SPU Performed at [facility], [address], [place], [place] [address]    Special Requests   Final    NONE Reflexed from [id number] Performed at [facility], [address], [place], [place] [address]    Gram Stain   Final    CULTURE REINCUBATED FOR BETTER GROWTH Performed at [facility], [address], [place], [place] [address]    Culture PENDING  Incomplete   Report Status PENDING  Incomplete     Labs: BNP (last 3 results) No results for input(s): BNP in the last 8760 hours. Basic Metabolic Panel: Recent Labs  Lab 12/06/17 1221 12/07/17 0550 12/08/17 0456  NA 135 135 135  K 2.9* 4.6 4.3  CL 100* 103 100*  CO2 24 25 24   GLUCOSE 167* 184* 209*  BUN 8 5* 7  CREATININE 0.64 0.59 0.58  CALCIUM 8.5* 8.3* 8.8*   Liver Function Tests: Recent Labs  Lab 12/06/17 1221 12/07/17 0550  AST 16 19  ALT 12* 12*  ALKPHOS 96 89  BILITOT 0.3 0.8  PROT 7.9 7.0  ALBUMIN 3.4* 2.8*   No results for input(s): LIPASE, AMYLASE in the last 168 hours. No  results for input(s):  AMMONIA in the last 168 hours. CBC: Recent Labs  Lab 12/06/17 1221 12/07/17 0643 12/08/17 0456  WBC 12.3* 10.6* 10.6*  NEUTROABS 8.0*  --   --   HGB 11.8* 11.4* 11.8*  HCT 36.4 35.2* 37.4  MCV 82.0 84.4 84.6  PLT 418* 421* 483*   Cardiac Enzymes: No results for input(s): CKTOTAL, CKMB, CKMBINDEX, TROPONINI in the last 168 hours. BNP: Invalid input(s): POCBNP CBG: Recent Labs  Lab 12/07/17 1147 12/07/17 1621 12/07/17 2153 12/08/17 0800 12/08/17 1137  GLUCAP 150* 159* 189* 211* 179*   D-Dimer No results for input(s): DDIMER in the last 72 hours. Hgb A1c Recent Labs    12/06/17 1845  HGBA1C 8.6*   Lipid Profile No results for input(s): CHOL, HDL, LDLCALC, TRIG, CHOLHDL, LDLDIRECT in the last 72 hours. Thyroid function studies No results for input(s): TSH, T4TOTAL, T3FREE, THYROIDAB in the last 72 hours.  Invalid input(s): FREET3 Anemia work up No results for input(s): VITAMINB12, FOLATE, FERRITIN, TIBC, IRON, RETICCTPCT in the last 72 hours. Urinalysis No results found for: COLORURINE, APPEARANCEUR, LABSPEC, PHURINE, GLUCOSEU, HGBUR, BILIRUBINUR, KETONESUR, PROTEINUR, UROBILINOGEN, NITRITE, LEUKOCYTESUR Sepsis Labs Invalid input(s): PROCALCITONIN,  WBC,  LACTICIDVEN Microbiology Recent Results (from the past 240 hour(s))  Blood culture (routine x 2)     Status: None (Preliminary result)   Collection Time: 12/06/17 12:15 PM  Result Value Ref Range Status   Specimen Description   Final    BLOOD BLOOD RIGHT FOREARM Performed at [facility] [place], [address] [place], [place] [address]    Special Requests   Final    BOTTLES DRAWN AEROBIC AND ANAEROBIC Blood Culture adequate volume Performed at [facility] [place], [address] [place], [place] [address]    Culture   Final    NO GROWTH 2 DAYS Performed at [facility], [address] [place], [place] [address]    Report Status PENDING  Incomplete  Blood culture (routine x 2)      Status: None (Preliminary result)   Collection Time: 12/06/17 12:19 PM  Result Value Ref Range Status   Specimen Description   Final    BLOOD BLOOD RIGHT HAND Performed at [facility] [place], [address] [place], [place] [address]    Special Requests   Final    BOTTLES DRAWN AEROBIC AND ANAEROBIC Blood Culture adequate volume Performed at [facility] [place], [address] [place], [place] [address]    Culture   Final    NO GROWTH 2 DAYS Performed at [facility], [address] [place], [place] [address]    Report Status PENDING  Incomplete  Culture, sputum-assessment     Status: None   Collection Time: 12/06/17  9:12 PM  Result Value Ref Range Status   Specimen Description SPU  Final   Special Requests NONE  Final   Sputum evaluation   Final    THIS SPECIMEN IS ACCEPTABLE FOR SPUTUM CULTURE Performed at [facility], [address] [place], [place] [address]    Report Status 12/06/2017 FINAL  Final  Culture, respiratory (NON-Expectorated)     Status: None (Preliminary result)   Collection Time: 12/06/17  9:12 PM  Result Value Ref Range Status   Specimen Description   Final    SPU Performed at [facility], [address] [place], [place] [address]    Special Requests   Final  NONE Reflexed from [id number] Performed at [facility], [address] [place], [place] [address]    Gram Stain   Final    CULTURE REINCUBATED FOR BETTER GROWTH Performed at [facility], [address] [place], [place] [address]    Culture PENDING  Incomplete   Report Status PENDING  Incomplete     Time coordinating discharge: 35 minutes  SIGNED:  Latrelle Dodrill, MD  [facility] 12/08/2017, 12:34 PM  Pager please text page via  [email or web address]  Note - This record has been created using [employer]. Chart creation errors have been sought, but may not always have been located. Such creation errors  do not reflect on the standard of medical care.

## 2017-12-09 LAB — CULTURE, RESPIRATORY

## 2017-12-09 LAB — CULTURE, RESPIRATORY W GRAM STAIN: Culture: NORMAL

## 2017-12-11 LAB — CULTURE, BLOOD (ROUTINE X 2)
CULTURE: NO GROWTH
Culture: NO GROWTH
Special Requests: ADEQUATE
Special Requests: ADEQUATE

## 2017-12-14 ENCOUNTER — Other Ambulatory Visit (HOSPITAL_COMMUNITY): Payer: Self-pay | Admitting: Family Medicine

## 2017-12-14 ENCOUNTER — Ambulatory Visit (HOSPITAL_COMMUNITY)
Admission: RE | Admit: 2017-12-14 | Discharge: 2017-12-14 | Disposition: A | Discharge status: Home / Self Care | Payer: Medicare Other | Source: Ambulatory Visit | Attending: Family Medicine | Admitting: Family Medicine

## 2017-12-14 DIAGNOSIS — J189 Pneumonia, unspecified organism: Secondary | ICD-10-CM | POA: Diagnosis not present

## 2017-12-14 DIAGNOSIS — R918 Other nonspecific abnormal finding of lung field: Secondary | ICD-10-CM | POA: Diagnosis not present

## 2017-12-30 ENCOUNTER — Ambulatory Visit (HOSPITAL_COMMUNITY)
Admission: RE | Admit: 2017-12-30 | Discharge: 2017-12-30 | Disposition: A | Discharge status: Home / Self Care | Payer: Medicare Other | Source: Ambulatory Visit | Attending: Family Medicine | Admitting: Family Medicine

## 2017-12-30 ENCOUNTER — Other Ambulatory Visit (HOSPITAL_COMMUNITY): Payer: Self-pay | Admitting: Family Medicine

## 2017-12-30 DIAGNOSIS — Z8701 Personal history of pneumonia (recurrent): Secondary | ICD-10-CM | POA: Diagnosis present

## 2018-09-03 ENCOUNTER — Emergency Department (HOSPITAL_BASED_OUTPATIENT_CLINIC_OR_DEPARTMENT_OTHER): Payer: Medicare Other

## 2018-09-03 ENCOUNTER — Other Ambulatory Visit: Payer: Self-pay

## 2018-09-03 ENCOUNTER — Emergency Department (HOSPITAL_BASED_OUTPATIENT_CLINIC_OR_DEPARTMENT_OTHER)
Admission: EM | Admit: 2018-09-03 | Discharge: 2018-09-03 | Disposition: A | Discharge status: Home / Self Care | Payer: Medicare Other | Attending: Emergency Medicine | Admitting: Emergency Medicine

## 2018-09-03 ENCOUNTER — Encounter (HOSPITAL_BASED_OUTPATIENT_CLINIC_OR_DEPARTMENT_OTHER): Payer: Self-pay | Admitting: *Deleted

## 2018-09-03 DIAGNOSIS — Z79899 Other long term (current) drug therapy: Secondary | ICD-10-CM | POA: Diagnosis not present

## 2018-09-03 DIAGNOSIS — Y92003 Bedroom of unspecified non-institutional (private) residence as the place of occurrence of the external cause: Secondary | ICD-10-CM | POA: Insufficient documentation

## 2018-09-03 DIAGNOSIS — M542 Cervicalgia: Secondary | ICD-10-CM | POA: Diagnosis not present

## 2018-09-03 DIAGNOSIS — W06XXXA Fall from bed, initial encounter: Secondary | ICD-10-CM | POA: Insufficient documentation

## 2018-09-03 DIAGNOSIS — Z87891 Personal history of nicotine dependence: Secondary | ICD-10-CM | POA: Insufficient documentation

## 2018-09-03 DIAGNOSIS — Y9389 Activity, other specified: Secondary | ICD-10-CM | POA: Insufficient documentation

## 2018-09-03 DIAGNOSIS — E119 Type 2 diabetes mellitus without complications: Secondary | ICD-10-CM | POA: Insufficient documentation

## 2018-09-03 DIAGNOSIS — Z7984 Long term (current) use of oral hypoglycemic drugs: Secondary | ICD-10-CM | POA: Diagnosis not present

## 2018-09-03 DIAGNOSIS — S0990XA Unspecified injury of head, initial encounter: Secondary | ICD-10-CM | POA: Diagnosis present

## 2018-09-03 DIAGNOSIS — Y998 Other external cause status: Secondary | ICD-10-CM | POA: Diagnosis not present

## 2018-09-03 DIAGNOSIS — M546 Pain in thoracic spine: Secondary | ICD-10-CM | POA: Diagnosis not present

## 2018-09-03 DIAGNOSIS — W19XXXA Unspecified fall, initial encounter: Secondary | ICD-10-CM

## 2018-09-03 NOTE — ED Provider Notes (Signed)
[facility] EMERGENCY DEPARTMENT Provider Note   CSN: [id number] Arrival date & time: 09/03/18  1205     History   Chief Complaint Chief Complaint  Patient presents with  . Fall    HPI Jasmine Frye is a 67 y.o. female with a past medical history of DM 2, who presents today for evaluation of head and neck pain.  She reports that 4 days ago she fell and hit her head on the floor.  She has had left-sided headache since along with neck and back pain.  She denies any loss of consciousness, does not take any blood thinners.  She reports that she has pain on the posterior shoulders bilaterally.  She denies any weakness, numbness or tingling.  She has been trying headache powder at home without significant relief.  She denies any nausea vomiting or diarrhea.    HPI  Past Medical History:  Diagnosis Date  . Diabetes mellitus without complication [facility](HCC)     Patient Active Problem List   Diagnosis Date Noted  . Hypokalemia 12/06/2017  . Sepsis due to pneumonia (HCC) 12/06/2017  . Type 2 diabetes mellitus with other specified complication (HCC) 12/06/2017    Past Surgical History:  Procedure Laterality Date  . ABDOMINAL HYSTERECTOMY       OB History   No obstetric history on file.      Home Medications    Prior to Admission medications   Medication Sig Start Date End Date Taking? Authorizing Provider  Camphor-Eucalyptus-Menthol (VICKS VAPORUB) 4.7-1.2-2.6 % OINT Apply topically.    [provider]  Chlorphen-Phenyleph-ASA (ALKA-SELTZER PLUS COLD PO) Take 1 tablet by mouth daily as needed.    [provider]  losartan (COZAAR) 100 MG tablet Take 100 mg by mouth daily.    [provider]  pioglitazone (ACTOS) 30 MG tablet Take 30 mg by mouth daily. 10/30/17   [provider]  sitaGLIPtin-metformin (JANUMET) 50-1000 MG tablet Take 1 tablet by mouth 2 (two) times daily with a meal.    [provider]    Family  History History reviewed. No pertinent family history.  Social History Social History   Tobacco Use  . Smoking status: Former Games developermoker  . Smokeless tobacco: Never Used  Substance Use Topics  . Alcohol use: Never    Frequency: Never  . Drug use: Never     Allergies   Patient has no known allergies.   Review of Systems Review of Systems  Constitutional: Negative for chills and fever.  HENT: Negative for congestion and facial swelling.   Respiratory: Negative for chest tightness and shortness of breath.   Cardiovascular: Negative for chest pain and palpitations.  Gastrointestinal: Negative for abdominal pain, diarrhea, nausea and vomiting.  Musculoskeletal: Positive for back pain and neck pain.  Neurological: Positive for headaches. Negative for dizziness, syncope, weakness and light-headedness.  All other systems reviewed and are negative.    Physical Exam Updated Vital Signs BP (!) 164/74 (BP Location: Left Arm)   Pulse 74   Temp 98.3 F (36.8 C) (Oral)   Resp 18   Ht 5' 6.5" (1.689 m)   Wt 80.7 kg   SpO2 98%   BMI 28.30 kg/m   Physical Exam Vitals signs and nursing note reviewed.  Constitutional:      General: She is not in acute distress.    Appearance: She is well-developed and normal weight. She is not toxic-appearing.  HENT:     Head: Normocephalic and atraumatic. No raccoon eyes,  Battle's sign, abrasion or contusion.     Right Ear: Tympanic membrane normal.     Left Ear: Tympanic membrane normal.  Eyes:     Conjunctiva/sclera: Conjunctivae normal.  Neck:     Comments: Initial neck ROM not tested.  After scans resulted she has full pain-free range of motion of her neck.  No step-offs or deformities to midline C-spine. Cardiovascular:     Rate and Rhythm: Normal rate and regular rhythm.     Heart sounds: No murmur.  Pulmonary:     Effort: Pulmonary effort is normal. No respiratory distress.     Breath sounds: Normal breath sounds.  Abdominal:      General: There is no distension.     Palpations: Abdomen is soft.     Tenderness: There is no abdominal tenderness.  Musculoskeletal:     Comments: Diffuse tenderness of T-spine midline without step-offs or deformities.  There is no lumbar spinal tenderness to palpation. Generalized tenderness to palpation along superior posterior shoulder and paraspinal muscles on neck.  Palpation over these areas both re-creates and exacerbates her reported pain.  Skin:    General: Skin is warm and dry.  Neurological:     General: No focal deficit present.     Mental Status: She is alert.     Comments: Mental Status:  Alert, oriented, thought content appropriate, able to give a coherent history. Speech fluent without evidence of aphasia. Able to follow 2 step commands without difficulty.  Cranial Nerves:  II:  Peripheral visual fields grossly normal, pupils equal, round, reactive to light III,IV, VI: ptosis not present, extra-ocular motions intact bilaterally  V,VII: smile symmetric, facial light touch sensation equal VIII: hearing grossly normal to voice  X: uvula elevates symmetrically  XI: bilateral shoulder shrug symmetric and strong XII: midline tongue extension without fassiculations Motor:  Normal tone. 5/5 in upper and lower extremities bilaterally including strong and equal grip strength and dorsiflexion/plantar flexion Gait: normal gait and balance CV: distal pulses palpable throughout        ED Treatments / Results  Labs (all labs ordered are listed, but only abnormal results are displayed) Labs Reviewed - No data to display  EKG None  Radiology Dg Chest 2 View  Result Date: 09/03/2018 CLINICAL DATA:  67 year old who fell four days ago when she slipped on wet grass outside. BILATERAL shoulder pain and thoracic back pain. Initial encounter. EXAM: CHEST - 2 VIEW COMPARISON:  12/30/2017 and earlier. FINDINGS: Cardiac silhouette normal in size, unchanged. Thoracic aorta mildly  atherosclerotic, unchanged. Hilar and mediastinal contours otherwise unremarkable. Mildly prominent bronchovascular markings diffusely and mild central peribronchial thickening, unchanged. Lungs otherwise clear. No localized airspace consolidation. No pleural effusions. No pneumothorax. Normal pulmonary vascularity. Degenerative changes involving the thoracic spine. No interval change. IMPRESSION: No acute cardiopulmonary disease. Stable mild changes of chronic bronchitis and/or asthma. Electronically Signed   By: Hulan Saashomas  Lawrence M.D.   On: 09/03/2018 15:32   Dg Thoracic Spine 2 View  Result Date: 09/03/2018 CLINICAL DATA:  67 year old who fell four days ago when she slipped on wet grass outside. BILATERAL shoulder pain and thoracic back pain. Initial encounter. EXAM: THORACIC SPINE 2 VIEWS COMPARISON:  No prior thoracic spine imaging. Multiple prior chest x-rays. FINDINGS: Twelve rib-bearing thoracic vertebrae with anatomic alignment. No fractures. Mild degenerative disc disease and spondylosis throughout the thoracic spine. Mild degenerative disc disease and spondylosis involving the visualized lower cervical spine on the swimmer's view. IMPRESSION: 1. No acute osseous abnormality. 2. Mild  degenerative disc disease and spondylosis throughout the lower cervical and thoracic spine. Electronically Signed   By: Hulan Saas M.D.   On: 09/03/2018 15:35   Ct Head Wo Contrast  Result Date: 09/03/2018 CLINICAL DATA:  Neck pain and dizziness after fall several days ago. No loss of consciousness. EXAM: CT HEAD WITHOUT CONTRAST CT CERVICAL SPINE WITHOUT CONTRAST TECHNIQUE: Multidetector CT imaging of the head and cervical spine was performed following the standard protocol without intravenous contrast. Multiplanar CT image reconstructions of the cervical spine were also generated. COMPARISON:  None. FINDINGS: CT HEAD FINDINGS Brain: No evidence of acute infarction, hemorrhage, hydrocephalus, extra-axial  collection or mass lesion/mass effect. Vascular: No hyperdense vessel or unexpected calcification. Skull: Normal. Negative for fracture or focal lesion. Sinuses/Orbits: No acute finding. Other: None. CT CERVICAL SPINE FINDINGS Alignment: Normal. Skull base and vertebrae: No acute fracture. No primary bone lesion or focal pathologic process. Soft tissues and spinal canal: No prevertebral fluid or swelling. No visible canal hematoma. Disc levels: Mild degenerative disc disease is noted at C3-4, C5-6 and C6-7 with anterior posterior osteophyte formation. Upper chest: Negative. Other: Degenerative changes are seen involving the posterior facet joints bilaterally. IMPRESSION: Normal head CT. Mild multilevel degenerative disc disease. No acute abnormality seen in the cervical spine. Electronically Signed   By: Lupita Raider, M.D.   On: 09/03/2018 15:44   Ct Cervical Spine Wo Contrast  Result Date: 09/03/2018 CLINICAL DATA:  Neck pain and dizziness after fall several days ago. No loss of consciousness. EXAM: CT HEAD WITHOUT CONTRAST CT CERVICAL SPINE WITHOUT CONTRAST TECHNIQUE: Multidetector CT imaging of the head and cervical spine was performed following the standard protocol without intravenous contrast. Multiplanar CT image reconstructions of the cervical spine were also generated. COMPARISON:  None. FINDINGS: CT HEAD FINDINGS Brain: No evidence of acute infarction, hemorrhage, hydrocephalus, extra-axial collection or mass lesion/mass effect. Vascular: No hyperdense vessel or unexpected calcification. Skull: Normal. Negative for fracture or focal lesion. Sinuses/Orbits: No acute finding. Other: None. CT CERVICAL SPINE FINDINGS Alignment: Normal. Skull base and vertebrae: No acute fracture. No primary bone lesion or focal pathologic process. Soft tissues and spinal canal: No prevertebral fluid or swelling. No visible canal hematoma. Disc levels: Mild degenerative disc disease is noted at C3-4, C5-6 and C6-7 with  anterior posterior osteophyte formation. Upper chest: Negative. Other: Degenerative changes are seen involving the posterior facet joints bilaterally. IMPRESSION: Normal head CT. Mild multilevel degenerative disc disease. No acute abnormality seen in the cervical spine. Electronically Signed   By: Lupita Raider, M.D.   On: 09/03/2018 15:44    Procedures Procedures (including critical care time)  Medications Ordered in ED Medications - No data to display   Initial Impression / Assessment and Plan / ED Course  I have reviewed the triage vital signs and the nursing notes.  Pertinent labs & imaging results that were available during my care of the patient were reviewed by me and considered in my medical decision making (see chart for details).    Patient presents today for evaluation of 4 days of headache after a mechanical slip and fall at home.  She denies loss of consciousness or blood thinner use.  She does have midline C-spine and T-spine tenderness to palpation.  CT head and neck were obtained without evidence of intracranial abnormalities or fracture.  X-rays of T-spine and chest were both without acute abnormality.  Suspect that her headache is from a concussion from striking her head.  She is neurologically  intact on my exam.  Given follow-up with concussion clinic and instructed to follow-up with PCP.  Suspect that additional pains are general musculoskeletal related, specifically trapezius muscle spasm.  Given time since injury and lack of chest pain, or shortness of breath not consistent with severe intrathoracic or abdominal injury.  She was informed that her blood pressure is elevated today and that she needs to follow-up with her primary care doctor in 1 week for recheck.  Return precautions were discussed with patient who states their understanding.  At the time of discharge patient denied any unaddressed complaints or concerns.  Patient is agreeable for discharge home.   Final  Clinical Impressions(s) / ED Diagnoses   Final diagnoses:  Fall, initial encounter  Injury of head, initial encounter  Neck pain  Acute midline thoracic back pain    ED Discharge Orders    None       Norman Clay 09/03/18 2033    Tilden Fossa, MD 09/04/18 1119

## 2018-09-03 NOTE — ED Notes (Signed)
ED Provider at bedside. 

## 2018-09-03 NOTE — Discharge Instructions (Signed)
Please take Tylenol (acetaminophen) to relieve your pain.  You may take tylenol, up to 1,000 mg (two extra strength pills).  Do not take more than 3,000 mg tylenol in a 24 hour period.  Please check all medication labels as many medications such as pain and cold medications may contain tylenol. Please do not drink alcohol while taking this medication.   The best way to get rid of muscle pain is by taking NSAIDS, using heat, massage therapy, and gentle stretching/range of motion exercises.

## 2018-09-03 NOTE — ED Triage Notes (Addendum)
Fall 4 days ago out of the bed.  Reports that she hit her forehead on the floor.  Head pain in the spot where she hit her head since that time.  Denies blood thinners, denies LOC.

## 2018-11-15 ENCOUNTER — Emergency Department (HOSPITAL_BASED_OUTPATIENT_CLINIC_OR_DEPARTMENT_OTHER)
Admission: EM | Admit: 2018-11-15 | Discharge: 2018-11-15 | Disposition: A | Discharge status: Home / Self Care | Payer: Medicare Other | Attending: Emergency Medicine | Admitting: Emergency Medicine

## 2018-11-15 ENCOUNTER — Encounter (HOSPITAL_BASED_OUTPATIENT_CLINIC_OR_DEPARTMENT_OTHER): Payer: Self-pay

## 2018-11-15 ENCOUNTER — Other Ambulatory Visit: Payer: Self-pay

## 2018-11-15 DIAGNOSIS — Z87891 Personal history of nicotine dependence: Secondary | ICD-10-CM | POA: Diagnosis not present

## 2018-11-15 DIAGNOSIS — E119 Type 2 diabetes mellitus without complications: Secondary | ICD-10-CM | POA: Insufficient documentation

## 2018-11-15 DIAGNOSIS — Z79899 Other long term (current) drug therapy: Secondary | ICD-10-CM | POA: Diagnosis not present

## 2018-11-15 DIAGNOSIS — Z7984 Long term (current) use of oral hypoglycemic drugs: Secondary | ICD-10-CM | POA: Insufficient documentation

## 2018-11-15 DIAGNOSIS — J111 Influenza due to unidentified influenza virus with other respiratory manifestations: Secondary | ICD-10-CM | POA: Diagnosis not present

## 2018-11-15 DIAGNOSIS — R05 Cough: Secondary | ICD-10-CM | POA: Diagnosis present

## 2018-11-15 DIAGNOSIS — R0981 Nasal congestion: Secondary | ICD-10-CM | POA: Diagnosis not present

## 2018-11-15 DIAGNOSIS — R69 Illness, unspecified: Secondary | ICD-10-CM

## 2018-11-15 MED ORDER — OSELTAMIVIR PHOSPHATE 75 MG PO CAPS: 75.0000 mg | ORAL_CAPSULE | Freq: Two times a day (BID) | ORAL | 0 refills | Status: DC

## 2018-11-15 NOTE — ED Triage Notes (Signed)
Pt c/o flu like symptoms- husband here for same.

## 2018-11-15 NOTE — ED Notes (Signed)
Pt requesting prescription for tamiflu.

## 2018-11-15 NOTE — ED Provider Notes (Signed)
[facility] EMERGENCY DEPARTMENT Provider Note   CSN: [id number] Arrival date & time: 11/15/18  1909    History   Chief Complaint No chief complaint on file.   Jasmine Frye is a 68 y.o. female.     Pt presents to the ED today with cough and runny nose.  Her husband was seen and likely has the flu.  She requests Tamiflu also.      Past Medical History:  Diagnosis Date  . Diabetes mellitus without complication [facility])     Patient Active Problem List   Diagnosis Date Noted  . Hypokalemia 12/06/2017  . Sepsis due to pneumonia (HCC) 12/06/2017  . Type 2 diabetes mellitus with other specified complication (HCC) 12/06/2017    Past Surgical History:  Procedure Laterality Date  . ABDOMINAL HYSTERECTOMY       OB History   No obstetric history on file.      Home Medications    Prior to Admission medications   Medication Sig Start Date End Date Taking? Authorizing Provider  Camphor-Eucalyptus-Menthol (VICKS VAPORUB) 4.7-1.2-2.6 % OINT Apply topically.    [provider]  Chlorphen-Phenyleph-ASA (ALKA-SELTZER PLUS COLD PO) Take 1 tablet by mouth daily as needed.    [provider]  losartan (COZAAR) 100 MG tablet Take 100 mg by mouth daily.    [provider]  oseltamivir (TAMIFLU) 75 MG capsule Take 1 capsule (75 mg total) by mouth 2 (two) times daily. 11/15/18   Jacalyn Lefevre, MD  pioglitazone (ACTOS) 30 MG tablet Take 30 mg by mouth daily. 10/30/17   [provider]  sitaGLIPtin-metformin (JANUMET) 50-1000 MG tablet Take 1 tablet by mouth 2 (two) times daily with a meal.    [provider]    Family History No family history on file.  Social History Social History   Tobacco Use  . Smoking status: Former Games developer  . Smokeless tobacco: Never Used  Substance Use Topics  . Alcohol use: Never    Frequency: Never  . Drug use: Never     Allergies   Patient has no known allergies.   Review of  Systems Review of Systems  HENT: Positive for congestion.   Respiratory: Positive for cough.   All other systems reviewed and are negative.    Physical Exam Updated Vital Signs BP 140/77 (BP Location: Right Arm)   Pulse 87   Temp 98.7 F (37.1 C) (Oral)   Resp 17   Ht 5\' 5"  (1.651 m)   Wt 81.6 kg   SpO2 99%   BMI 29.95 kg/m   Physical Exam Vitals signs and nursing note reviewed.  Constitutional:      Appearance: Normal appearance.  HENT:     Head: Normocephalic and atraumatic.     Right Ear: External ear normal.     Left Ear: External ear normal.     Nose: Congestion present.     Mouth/Throat:     Mouth: Mucous membranes are moist.  Eyes:     Extraocular Movements: Extraocular movements intact.     Pupils: Pupils are equal, round, and reactive to light.  Neck:     Musculoskeletal: Normal range of motion and neck supple.  Cardiovascular:     Rate and Rhythm: Normal rate and regular rhythm.     Pulses: Normal pulses.     Heart sounds: Normal heart sounds.  Pulmonary:     Effort: Pulmonary effort is normal.     Breath sounds: Normal breath sounds.  Abdominal:  General: Abdomen is flat.  Musculoskeletal: Normal range of motion.  Skin:    General: Skin is warm.     Capillary Refill: Capillary refill takes less than 2 seconds.  Neurological:     General: No focal deficit present.     Mental Status: She is alert and oriented to person, place, and time.  Psychiatric:        Mood and Affect: Mood normal.        Behavior: Behavior normal.      ED Treatments / Results  Labs (all labs ordered are listed, but only abnormal results are displayed) Labs Reviewed - No data to display  EKG None  Radiology No results found.  Procedures Procedures (including critical care time)  Medications Ordered in ED Medications - No data to display   Initial Impression / Assessment and Plan / ED Course  I have reviewed the triage vital signs and the nursing  notes.  Pertinent labs & imaging results that were available during my care of the patient were reviewed by me and considered in my medical decision making (see chart for details).       Pt will be given a rx for tamiflu.  She knows to return if worse.  Final Clinical Impressions(s) / ED Diagnoses   Final diagnoses:  Influenza-like illness    ED Discharge Orders         Ordered    oseltamivir (TAMIFLU) 75 MG capsule  2 times daily     11/15/18 1914           Jacalyn Lefevre, MD 11/15/18 [phone]

## 2019-04-03 IMAGING — DX DG CHEST 2V
2 series · 2 of 2 positions shown · non-contrast
Comparison: Radiographs December 14, 2017.

CLINICAL DATA: Shortness of breath.

EXAM:
CHEST - 2 VIEW

[chest pa]
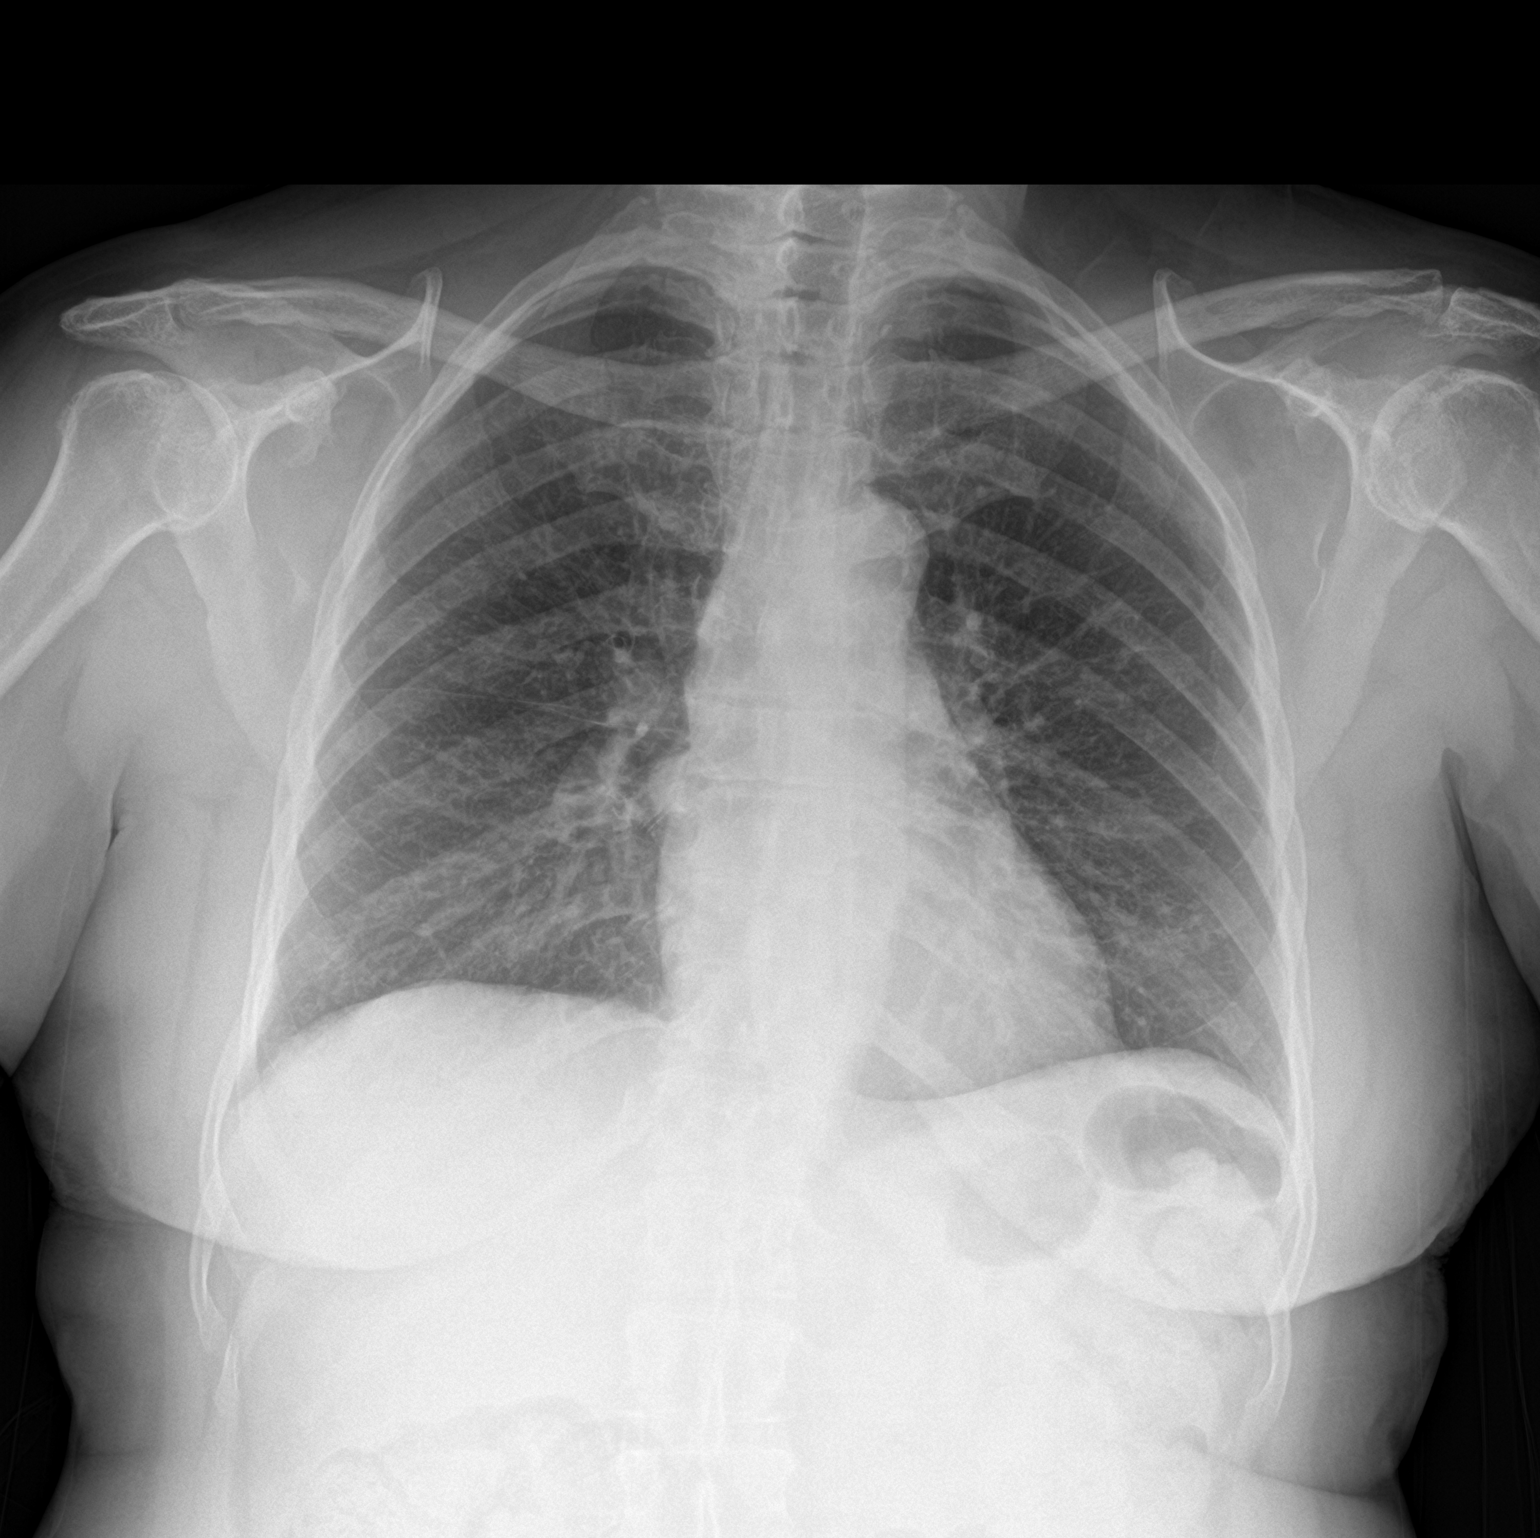

[chest lat]
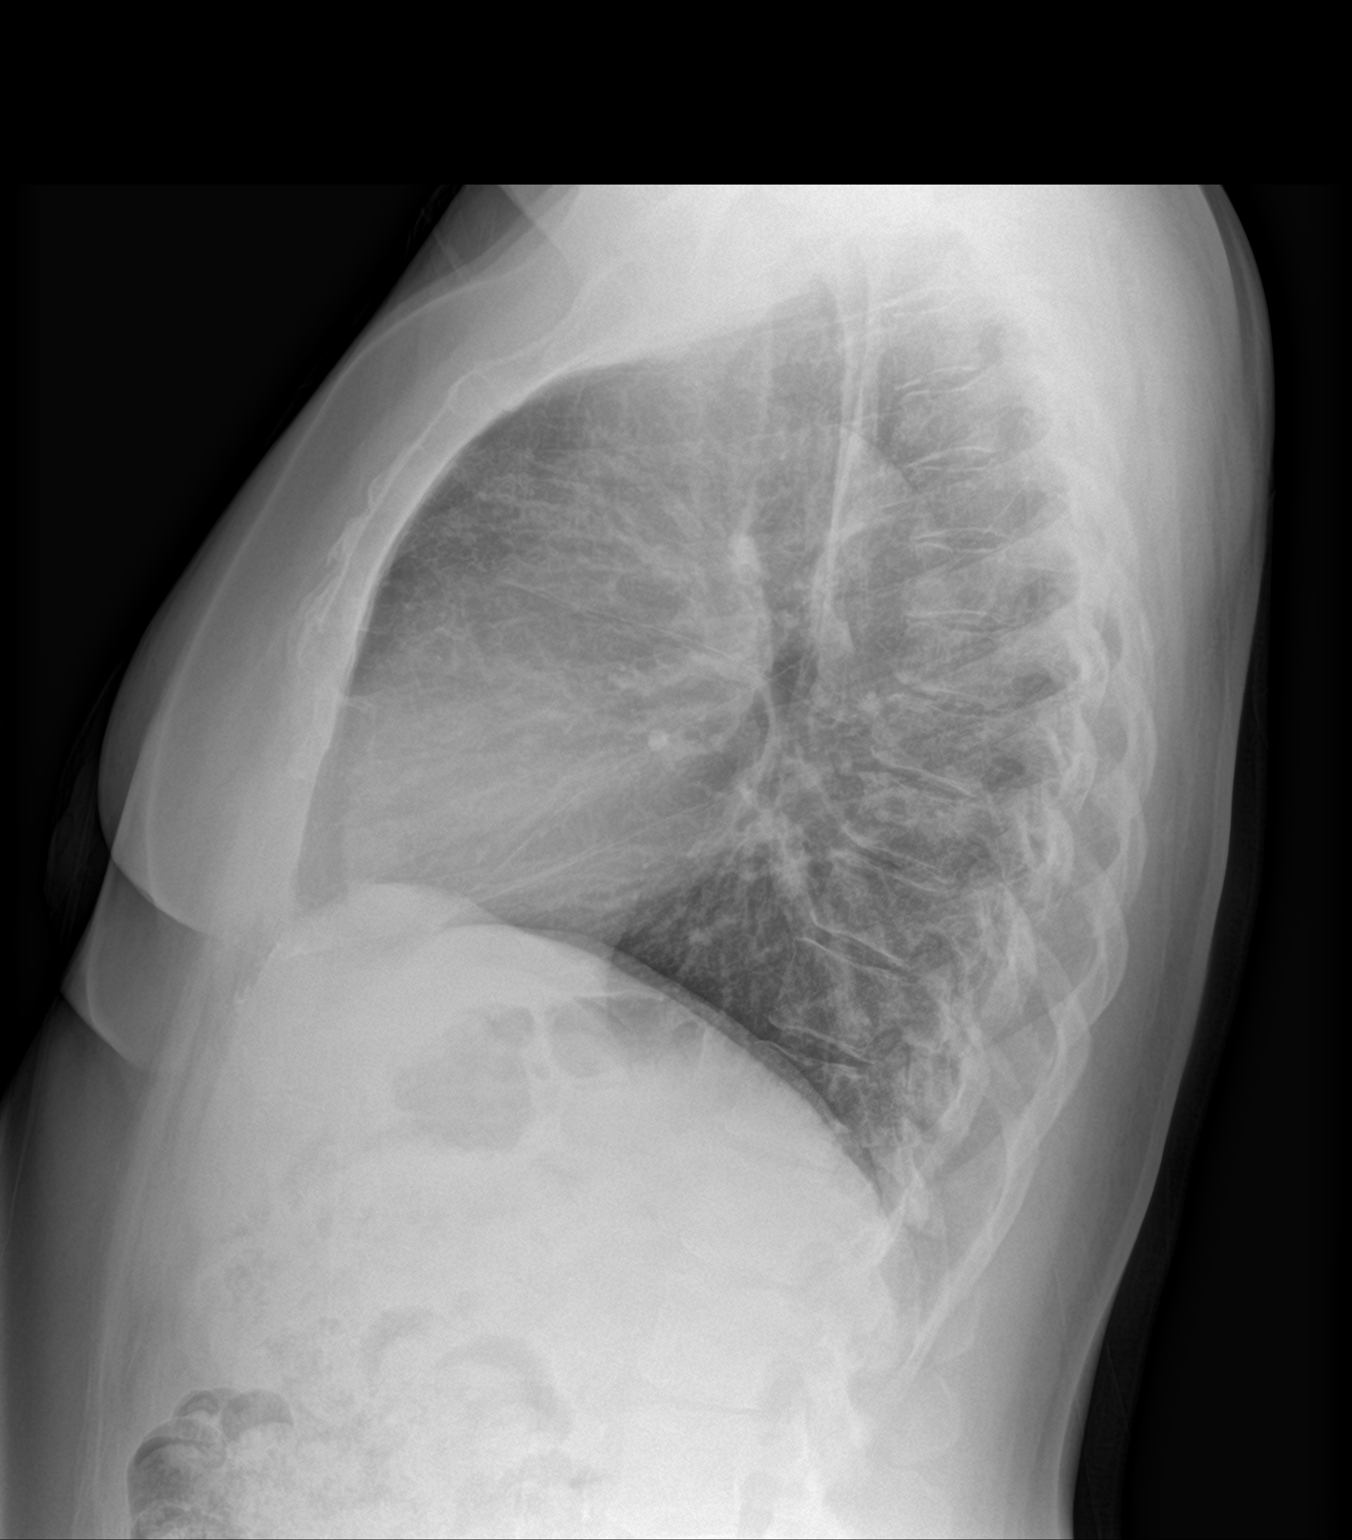

[2 of 2 positions shown; findings below may reference images not displayed]

FINDINGS: The heart size and mediastinal contours are within normal limits.
Both lungs are clear. No pneumothorax or pleural effusion is noted.
The visualized skeletal structures are unremarkable.
IMPRESSION: No active cardiopulmonary disease.

## 2020-05-31 ENCOUNTER — Other Ambulatory Visit: Payer: Self-pay

## 2020-05-31 ENCOUNTER — Emergency Department (HOSPITAL_BASED_OUTPATIENT_CLINIC_OR_DEPARTMENT_OTHER): Payer: No Typology Code available for payment source

## 2020-05-31 ENCOUNTER — Emergency Department (HOSPITAL_BASED_OUTPATIENT_CLINIC_OR_DEPARTMENT_OTHER)
Admission: EM | Admit: 2020-05-31 | Discharge: 2020-05-31 | Disposition: A | Discharge status: Home / Self Care | Payer: No Typology Code available for payment source | Attending: Emergency Medicine | Admitting: Emergency Medicine

## 2020-05-31 ENCOUNTER — Encounter (HOSPITAL_BASED_OUTPATIENT_CLINIC_OR_DEPARTMENT_OTHER): Payer: Self-pay | Admitting: *Deleted

## 2020-05-31 DIAGNOSIS — S060X0A Concussion without loss of consciousness, initial encounter: Secondary | ICD-10-CM | POA: Insufficient documentation

## 2020-05-31 DIAGNOSIS — Y92811 Bus as the place of occurrence of the external cause: Secondary | ICD-10-CM | POA: Diagnosis not present

## 2020-05-31 DIAGNOSIS — W228XXA Striking against or struck by other objects, initial encounter: Secondary | ICD-10-CM | POA: Diagnosis not present

## 2020-05-31 DIAGNOSIS — S0990XA Unspecified injury of head, initial encounter: Secondary | ICD-10-CM

## 2020-05-31 DIAGNOSIS — Z87891 Personal history of nicotine dependence: Secondary | ICD-10-CM | POA: Insufficient documentation

## 2020-05-31 DIAGNOSIS — I10 Essential (primary) hypertension: Secondary | ICD-10-CM | POA: Insufficient documentation

## 2020-05-31 DIAGNOSIS — E1169 Type 2 diabetes mellitus with other specified complication: Secondary | ICD-10-CM | POA: Diagnosis not present

## 2020-05-31 DIAGNOSIS — Y990 Civilian activity done for income or pay: Secondary | ICD-10-CM | POA: Insufficient documentation

## 2020-05-31 DIAGNOSIS — Z7984 Long term (current) use of oral hypoglycemic drugs: Secondary | ICD-10-CM | POA: Diagnosis not present

## 2020-05-31 DIAGNOSIS — Z79899 Other long term (current) drug therapy: Secondary | ICD-10-CM | POA: Insufficient documentation

## 2020-05-31 HISTORY — DX: Essential (primary) hypertension: I10
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
## 2020-05-31 NOTE — Discharge Instructions (Signed)
Please follow-up with your primary care provider regarding today's encounter.  Your head injury with persistent headache and disequilibrium is likely reflective of a concussion.  You will need to be cleared by your primary care provider before you can return to work or engage in contact sports.  As for your "whooshing" sensation in your right ear, I encourage you to follow-up with your otolaryngologist at [facility] on [facility] for ongoing evaluation and management should it continue to persist.    Address: [address], [place], [place] [address] Hours:  Open ? Closes 5PM Phone: [phone]  1. Medications: Ibuprofen or Tylenol for pain 2. Treatment: Rest, can use ice on head. Recommend that you remain in a quiet, not simulating, dark environment. No TV, computer use, video games until headache is resolved completely. No contact sports until cleared by your pediatrician or PCP. 3. Follow Up: With primary care physician on Monday if headache persists.  Return to the emergency department if you become lethargic, begin vomiting, develop double vision, speech difficulty, problems walking, or other change in mental status.

## 2020-10-19 ENCOUNTER — Emergency Department (HOSPITAL_BASED_OUTPATIENT_CLINIC_OR_DEPARTMENT_OTHER)
Admission: EM | Admit: 2020-10-19 | Discharge: 2020-10-19 | Disposition: A | Discharge status: Home / Self Care | Payer: Medicare Other | Attending: Emergency Medicine | Admitting: Emergency Medicine

## 2020-10-19 ENCOUNTER — Encounter (HOSPITAL_BASED_OUTPATIENT_CLINIC_OR_DEPARTMENT_OTHER): Payer: Self-pay | Admitting: Emergency Medicine

## 2020-10-19 ENCOUNTER — Other Ambulatory Visit: Payer: Self-pay

## 2020-10-19 ENCOUNTER — Emergency Department (HOSPITAL_BASED_OUTPATIENT_CLINIC_OR_DEPARTMENT_OTHER): Payer: Medicare Other

## 2020-10-19 DIAGNOSIS — S0083XA Contusion of other part of head, initial encounter: Secondary | ICD-10-CM | POA: Diagnosis not present

## 2020-10-19 DIAGNOSIS — S0990XA Unspecified injury of head, initial encounter: Secondary | ICD-10-CM | POA: Diagnosis present

## 2020-10-19 DIAGNOSIS — Z87891 Personal history of nicotine dependence: Secondary | ICD-10-CM | POA: Insufficient documentation

## 2020-10-19 DIAGNOSIS — R55 Syncope and collapse: Secondary | ICD-10-CM | POA: Insufficient documentation

## 2020-10-19 DIAGNOSIS — I10 Essential (primary) hypertension: Secondary | ICD-10-CM | POA: Diagnosis not present

## 2020-10-19 DIAGNOSIS — W19XXXA Unspecified fall, initial encounter: Secondary | ICD-10-CM

## 2020-10-19 DIAGNOSIS — R002 Palpitations: Secondary | ICD-10-CM | POA: Diagnosis not present

## 2020-10-19 DIAGNOSIS — E119 Type 2 diabetes mellitus without complications: Secondary | ICD-10-CM | POA: Insufficient documentation

## 2020-10-19 DIAGNOSIS — Z79899 Other long term (current) drug therapy: Secondary | ICD-10-CM | POA: Diagnosis not present

## 2020-10-19 DIAGNOSIS — Z7984 Long term (current) use of oral hypoglycemic drugs: Secondary | ICD-10-CM | POA: Diagnosis not present

## 2020-10-19 DIAGNOSIS — W07XXXA Fall from chair, initial encounter: Secondary | ICD-10-CM | POA: Insufficient documentation

## 2020-10-19 LAB — BASIC METABOLIC PANEL
Anion gap: 8 (ref 5–15)
BUN: 12 mg/dL (ref 8–23)
CO2: 27 mmol/L (ref 22–32)
Calcium: 8.9 mg/dL (ref 8.9–10.3)
Chloride: 102 mmol/L (ref 98–111)
Creatinine, Ser: 0.63 mg/dL (ref 0.44–1.00)
GFR, Estimated: >60 mL/min (ref >60)
Glucose, Bld: 132 mg/dL — ABNORMAL HIGH (ref 70–99)
Potassium: 3.6 mmol/L (ref 3.5–5.1)
Sodium: 137 mmol/L (ref 135–145)

## 2020-10-19 LAB — CBC
HCT: 39.3 % (ref 36.0–46.0)
Hemoglobin: 12.6 g/dL (ref 12.0–15.0)
MCH: 26.8 pg (ref 26.0–34.0)
MCHC: 32.1 g/dL (ref 30.0–36.0)
MCV: 83.4 fL (ref 80.0–100.0)
Platelets: 302 10*3/uL (ref 150–400)
RBC: 4.71 MIL/uL (ref 3.87–5.11)
RDW: 13.6 % (ref 11.5–15.5)
WBC: 5.5 10*3/uL (ref 4.0–10.5)
nRBC: 0 % (ref 0.0–0.2)

## 2020-10-19 LAB — CBG MONITORING, ED: Glucose-Capillary: 125 mg/dL — ABNORMAL HIGH (ref 70–99)

## 2020-10-19 MED ORDER — HYDROCODONE-ACETAMINOPHEN 5-325 MG PO TABS
1.0000 | ORAL_TABLET | Freq: Once | ORAL | Status: AC
  Administered 2020-10-19: 1 via ORAL
  Filled 2020-10-19: qty 1

## 2020-10-19 NOTE — ED Notes (Signed)
Unable to provide u/a at this time 

## 2020-10-19 NOTE — ED Notes (Signed)
Ambulated to BR, no dizziness. States," I am ready to go home " ED PA informed , unable to give urine sample

## 2020-10-19 NOTE — Discharge Instructions (Signed)
As we discussed, your CT had looked reassuring here.  Please follow-up with your primary care doctor.  Return to the emergency department for any worsening pain, dizziness, chest pain, difficulty breathing, difficulty walking, numbness/weakness in arms or legs or any other worsening concerning symptoms.

## 2020-10-19 NOTE — ED Notes (Signed)
CBG 125 °

## 2020-10-19 NOTE — ED Triage Notes (Signed)
Pt arrives pov with c/o fall after feeling dizzy. Pt reports right side head pain. Pt endorses loc. Bruising noted to right side of forehead. Pt c/o feeling lightheaded.

## 2020-10-19 NOTE — ED Provider Notes (Signed)
[facility] EMERGENCY DEPARTMENT Provider Note   CSN: [id number] Arrival date & time: 10/19/20  7371     History Chief Complaint  Patient presents with  . Fall    Jasmine Frye is a 70 y.o. female with PMH/o DM, HTN who presents for evaluation of headache, head injury that occurred after a fall last night.  She is unclear of how exactly she fell.  She reports that she was watching TV and states that the next thing she remembers was feeling flushed, lightheaded, and had palpitations and then woke up on the ground.  She does not know she was trying to get up.  She states that this fall was unwitnessed.  She did not have any chest pain.  She endorses a feeling of feeling lightheaded but states she does not feel dizzy.  She reports that she thinks was only a few seconds that she had LOC.  She reports that her husband came in immediately and she was at baseline.  She reports pain to the right forehead with swelling.  She states she has had a minor headache since this happened.  She states that she has not had any nausea/vomiting, vision changes, numbness/weakness.  She is not on blood thinners.  He states that her son wanted her to get checked out which is why she came to the emergency department.  She states that she had a head injury in September 2021 and since then, she has had some intermittent neck pain, dizziness.  She states she has been evaluated and gotten MRIs which have all been negative.  Currently, she denies any new neck pain, vision changes, numbness/weakness of arms or legs, nausea/vomiting, chest pain, difficulty breathing, abdominal pain.  The history is provided by the patient.       Past Medical History:  Diagnosis Date  . Diabetes mellitus without complication (HCC)   . Hypertension     Patient Active Problem List   Diagnosis Date Noted  . Hypokalemia 12/06/2017  . Sepsis due to pneumonia (HCC) 12/06/2017  . Type 2 diabetes mellitus with other specified  complication (HCC) 12/06/2017    Past Surgical History:  Procedure Laterality Date  . ABDOMINAL HYSTERECTOMY       OB History    Gravida  3   Para  3   Term      Preterm      AB      Living        SAB      IAB      Ectopic      Multiple      Live Births              Family History  Problem Relation Age of Onset  . Diabetes Mother   . Heart attack Mother   . Cancer Father   . Aneurysm Sister   . Pneumonia Brother   . Diabetes Sister   . Pneumonia Brother     Social History   Tobacco Use  . Smoking status: Former Games developer  . Smokeless tobacco: Never Used  Substance Use Topics  . Alcohol use: Never  . Drug use: Never    Home Medications Prior to Admission medications   Medication Sig Start Date End Date Taking? Authorizing Provider  gabapentin (NEURONTIN) 100 MG capsule Take 100 mg by mouth at bedtime. 10/10/20  Yes [provider]  glipiZIDE (GLUCOTROL) 10 MG tablet Take 10 mg by mouth 2 (two) times daily before a meal.  Yes [provider]  losartan (COZAAR) 100 MG tablet Take 100 mg by mouth daily.   Yes [provider]  sitaGLIPtin-metformin (JANUMET) 50-1000 MG tablet Take 1 tablet by mouth 2 (two) times daily with a meal.   Yes [provider]    Allergies    Patient has no known allergies.  Review of Systems   Review of Systems  Constitutional: Negative for fever.  Respiratory: Negative for cough and shortness of breath.   Cardiovascular: Negative for chest pain.  Gastrointestinal: Negative for abdominal pain, nausea and vomiting.  Genitourinary: Negative for dysuria and hematuria.  Neurological: Positive for light-headedness and headaches.  All other systems reviewed and are negative.   Physical Exam Updated Vital Signs BP (!) 188/77 (BP Location: Right Arm)   Pulse 69   Temp 98.5 F (36.9 C) (Oral)   Resp 18   Ht 5\' 5"  (1.651 m)   Wt 80.7 kg   SpO2 97%   BMI 29.62 kg/m   Physical  Exam Vitals and nursing note reviewed.  Constitutional:      Appearance: Normal appearance. She is well-developed and well-nourished.  HENT:     Head: Normocephalic.      Comments: Hematoma noted to the right forehead.  No underlying skull deformity or crepitus noted.  No laceration.    Mouth/Throat:     Mouth: Oropharynx is clear and moist and mucous membranes are normal.  Eyes:     General: Lids are normal.     Extraocular Movements: EOM normal.     Conjunctiva/sclera: Conjunctivae normal.     Pupils: Pupils are equal, round, and reactive to light.     Comments: PERRL. EOMs intact. No nystagmus. No neglect.   Neck:     Vascular: No carotid bruit.     Comments: Full flexion/extension and lateral movement of neck fully intact. No bony midline tenderness. No deformities or crepitus. No carotid bruit bilaterally.  Cardiovascular:     Rate and Rhythm: Normal rate and regular rhythm.     Pulses: Normal pulses.     Heart sounds: Normal heart sounds. No murmur heard. No friction rub. No gallop.   Pulmonary:     Effort: Pulmonary effort is normal.     Breath sounds: Normal breath sounds.     Comments: Lungs clear to auscultation bilaterally.  Symmetric chest rise.  No wheezing, rales, rhonchi. Abdominal:     Palpations: Abdomen is soft. Abdomen is not rigid.     Tenderness: There is no abdominal tenderness. There is no guarding.  Musculoskeletal:        General: Normal range of motion.     Cervical back: Full passive range of motion without pain.  Skin:    General: Skin is warm and dry.     Capillary Refill: Capillary refill takes less than 2 seconds.  Neurological:     Mental Status: She is alert and oriented to person, place, and time.     Comments: Cranial nerves III-XII intact Follows commands, Moves all extremities  5/5 strength to BUE and BLE  Sensation intact throughout all major nerve distributions Normal finger to nose. No dysdiadochokinesia. No pronator drift. No gait  abnormalities  No slurred speech. No facial droop.   Psychiatric:        Mood and Affect: Mood and affect normal.        Speech: Speech normal.     ED Results / Procedures / Treatments   Labs (all labs ordered are listed,  but only abnormal results are displayed) Labs Reviewed  BASIC METABOLIC PANEL - Abnormal; Notable for the following components:      Result Value   Glucose, Bld 132 (*)    All other components within normal limits  CBG MONITORING, ED - Abnormal; Notable for the following components:   Glucose-Capillary 125 (*)    All other components within normal limits  CBC  URINALYSIS, ROUTINE W REFLEX MICROSCOPIC    EKG EKG Interpretation  Date/Time:  Saturday October 19 2020 10:02:47 EST Ventricular Rate:  68 PR Interval:  140 QRS Duration: 76 QT Interval:  402 QTC Calculation: 427 R Axis:   29 Text Interpretation: Normal sinus rhythm Cannot rule out Anterior infarct , age undetermined Abnormal ECG No significant change since last tracing Confirmed by Alvira MondaySchlossman, Erin ([id number]) on 10/19/2020 11:44:41 AM   Radiology CT Head Wo Contrast  Result Date: 10/19/2020 CLINICAL DATA:  Dizziness.  Fall. EXAM: CT HEAD WITHOUT CONTRAST TECHNIQUE: Contiguous axial images were obtained from the base of the skull through the vertex without intravenous contrast. COMPARISON:  May 31, 2020 FINDINGS: Brain: Ventricles and sulci are normal in size and configuration. There is no intracranial mass, hemorrhage, extra-axial fluid collection, or midline shift. The brain parenchyma appears unremarkable. There is no evident acute infarct. Vascular: No hyperdense vessel. There are foci of calcification in each carotid siphon region. Skull: Bony calvarium appears intact. Sinuses/Orbits: There is an apparent retention cyst in the posterior right sphenoid sinus, stable. Mild mucosal thickening noted in several ethmoid air cells. Visualized orbits appear symmetric bilaterally. Other: There is  opacification in several inferior mastoid air cells on the left, stable. Mastoids elsewhere are clear. IMPRESSION: Normal appearing brain parenchyma without evidence of acute infarct. No mass or hemorrhage. There are foci of arterial vascular calcification. There are foci of paranasal sinus disease as well as opacification of several inferior mastoid air cells on the left. Electronically Signed   By: Bretta BangWilliam  Woodruff III M.D.   On: 10/19/2020 11:10    Procedures Procedures   Medications Ordered in ED Medications  HYDROcodone-acetaminophen (NORCO/VICODIN) 5-325 MG per tablet 1 tablet (1 tablet Oral Given 10/19/20 1406)    ED Course  I have reviewed the triage vital signs and the nursing notes.  Pertinent labs & imaging results that were available during my care of the patient were reviewed by me and considered in my medical decision making (see chart for details).    MDM Rules/Calculators/A&P                          70 year old female who presents for evaluation of what sounds like a syncopal episode that occurred last night as well as headache.  Patient reports that she was does not know if she was trying to get up.  She was sitting on the chair watching TV and states that all of a sudden she felt lightheaded, flushed, had palpitations.  She reports that she was on the floor.  She thinks she had LOC for just a few seconds.  She called her husband and to help her get up.  She did not have any chest pain.  She states that she felt fine afterwards and was okay.  She states that she noticed that she had a small bump on her forehead.  She states that she did have some slight headache.  Her son came over and saw the bump on her head today and wanted her to get evaluated  by the ED.  She is not on blood thinners.  She denies any numbness/weakness, nausea/vomiting, difficulty walking.  She states she will sometimes get lightheaded and dizzy and states that this has been an ongoing issue since she had a  head injury in September 2021.  I specifically asked her if this dizziness was new origin started last night when she had this fall and she stated that no that this has been an ongoing issue for several months.  She has been evaluated for this and had an MRI that was unremarkable.  She states that she is otherwise at her normal baseline but just states that she wanted the hematoma checked out on her head.  On my evaluation, she is afebrile, nontoxic-appearing.  Vital signs are stable.  She has no neuro deficits noted on exam.  She is able to ambulate.  She does have a hematoma noted to the right side of her forehead.  No carotid bruit.  Given hematoma, we will plan to check CT head.  It sounds like she had prodrome prior to her syncopal episode.  She did not have any chest pain that preceded.  We will plan for labs.  CBC shows no leukocytosis, hemoglobin.  CBG is 125.  BMP shows normal pain and creatinine.  CT head shows no evidence of acute infarct, mass or hemorrhage.  She has some arterial vascular calcification and paranasal sinus disease.  Reevaluation.  Patient is walking without any difficulty.  She denies any other complaints at this time.  She does state that her head slightly hurts.  Will give analgesics.  I discussed with patient regarding her work-up.  Patient again told me that her dizziness has been intermittently occurring since her head injury in September 2021 and does not feel like it is new or changing.  She feels at her baseline.  She is ready to go home does not want any further testing here in the ED.  At this time, patient is without any neuro deficits.  She was still slightly hypertensive.  Instructed patient to follow-up with her doctor regarding this as well as her visit here today. At this time, patient exhibits no emergent life-threatening condition that require further evaluation in ED. Patient had ample opportunity for questions and discussion. All patient's questions were answered  with full understanding. Strict return precautions discussed. Patient expresses understanding and agreement to plan.   Portions of this note were generated with Scientist, clinical (histocompatibility and immunogenetics). Dictation errors may occur despite best attempts at proofreading.  Final Clinical Impression(s) / ED Diagnoses Final diagnoses:  Fall, initial encounter  Syncope, unspecified syncope type    Rx / DC Orders ED Discharge Orders    None       Rosana Hoes 10/19/20 1932    Alvira Monday, MD 10/20/20 [phone]

## 2021-04-07 ENCOUNTER — Emergency Department (HOSPITAL_BASED_OUTPATIENT_CLINIC_OR_DEPARTMENT_OTHER)
Admission: EM | Admit: 2021-04-07 | Discharge: 2021-04-08 | Disposition: A | Discharge status: Home / Self Care | Payer: Medicare Other | Attending: Emergency Medicine | Admitting: Emergency Medicine

## 2021-04-07 ENCOUNTER — Other Ambulatory Visit: Payer: Self-pay

## 2021-04-07 ENCOUNTER — Encounter (HOSPITAL_BASED_OUTPATIENT_CLINIC_OR_DEPARTMENT_OTHER): Payer: Self-pay | Admitting: *Deleted

## 2021-04-07 ENCOUNTER — Emergency Department (HOSPITAL_BASED_OUTPATIENT_CLINIC_OR_DEPARTMENT_OTHER): Payer: Medicare Other

## 2021-04-07 DIAGNOSIS — E119 Type 2 diabetes mellitus without complications: Secondary | ICD-10-CM | POA: Diagnosis not present

## 2021-04-07 DIAGNOSIS — Z7984 Long term (current) use of oral hypoglycemic drugs: Secondary | ICD-10-CM | POA: Insufficient documentation

## 2021-04-07 DIAGNOSIS — R42 Dizziness and giddiness: Secondary | ICD-10-CM

## 2021-04-07 DIAGNOSIS — Z87891 Personal history of nicotine dependence: Secondary | ICD-10-CM | POA: Diagnosis not present

## 2021-04-07 DIAGNOSIS — Z79899 Other long term (current) drug therapy: Secondary | ICD-10-CM | POA: Diagnosis not present

## 2021-04-07 DIAGNOSIS — I10 Essential (primary) hypertension: Secondary | ICD-10-CM | POA: Diagnosis not present

## 2021-04-07 DIAGNOSIS — U071 COVID-19: Secondary | ICD-10-CM

## 2021-04-07 LAB — BASIC METABOLIC PANEL
Anion gap: 12 (ref 5–15)
BUN: 9 mg/dL (ref 8–23)
CO2: 28 mmol/L (ref 22–32)
Calcium: 8.8 mg/dL — ABNORMAL LOW (ref 8.9–10.3)
Chloride: 93 mmol/L — ABNORMAL LOW (ref 98–111)
Creatinine, Ser: 0.69 mg/dL (ref 0.44–1.00)
GFR, Estimated: >60 mL/min (ref >60)
Glucose, Bld: 217 mg/dL — ABNORMAL HIGH (ref 70–99)
Potassium: 3.7 mmol/L (ref 3.5–5.1)
Sodium: 133 mmol/L — ABNORMAL LOW (ref 135–145)

## 2021-04-07 LAB — CBC
HCT: 38 % (ref 36.0–46.0)
Hemoglobin: 12.4 g/dL (ref 12.0–15.0)
MCH: 27 pg (ref 26.0–34.0)
MCHC: 32.6 g/dL (ref 30.0–36.0)
MCV: 82.8 fL (ref 80.0–100.0)
Platelets: 307 10*3/uL (ref 150–400)
RBC: 4.59 MIL/uL (ref 3.87–5.11)
RDW: 13.2 % (ref 11.5–15.5)
WBC: 6.7 10*3/uL (ref 4.0–10.5)
nRBC: 0 % (ref 0.0–0.2)

## 2021-04-07 NOTE — ED Triage Notes (Signed)
C/o dizziness x 1 day

## 2021-04-08 ENCOUNTER — Encounter (HOSPITAL_BASED_OUTPATIENT_CLINIC_OR_DEPARTMENT_OTHER): Payer: Self-pay | Admitting: Emergency Medicine

## 2021-04-08 ENCOUNTER — Emergency Department (HOSPITAL_BASED_OUTPATIENT_CLINIC_OR_DEPARTMENT_OTHER): Payer: Medicare Other

## 2021-04-08 DIAGNOSIS — U071 COVID-19: Secondary | ICD-10-CM | POA: Diagnosis not present

## 2021-04-08 LAB — RESP PANEL BY RT-PCR (FLU A&B, COVID) ARPGX2
Influenza A by PCR: NEGATIVE
Influenza B by PCR: NEGATIVE
SARS Coronavirus 2 by RT PCR: POSITIVE — AB

## 2021-04-08 MED ORDER — MECLIZINE HCL 25 MG PO TABS
25.0000 mg | ORAL_TABLET | Freq: Once | ORAL | Status: AC
  Administered 2021-04-08: 25 mg via ORAL
  Filled 2021-04-08: qty 1

## 2021-04-08 MED ORDER — IOHEXOL 350 MG/ML SOLN
75.0000 mL | Freq: Once | INTRAVENOUS | Status: AC | PRN
  Administered 2021-04-08: 75 mL via INTRAVENOUS

## 2021-04-08 MED ORDER — MECLIZINE HCL 12.5 MG PO TABS: 12.5000 mg | ORAL_TABLET | Freq: Three times a day (TID) | ORAL | 0 refills | Status: AC | PRN

## 2021-04-08 NOTE — Discharge Instructions (Addendum)
Person Under Monitoring Name: Jasmine Frye  Location: [address] [facility] [place] [address]   Infection Prevention Recommendations for Individuals Confirmed to have, or Being Evaluated for, 2019 Novel Coronavirus (COVID-19) Infection Who Receive Care at Home  Individuals who are confirmed to have, or are being evaluated for, COVID-19 should follow the prevention steps below until a healthcare provider or local or state health department says they can return to normal activities.  Stay home except to get medical care You should restrict activities outside your home, except for getting medical care. Do not go to work, school, or public areas, and do not use public transportation or taxis.  Call ahead before visiting your doctor Before your medical appointment, call the healthcare provider and tell them that you have, or are being evaluated for, COVID-19 infection. This will help the healthcare provider's office take steps to keep other people from getting infected. Ask your healthcare provider to call the local or state health department.  Monitor your symptoms Seek prompt medical attention if your illness is worsening (e.g., difficulty breathing). Before going to your medical appointment, call the healthcare provider and tell them that you have, or are being evaluated for, COVID-19 infection. Ask your healthcare provider to call the local or state health department.  Wear a facemask You should wear a facemask that covers your nose and mouth when you are in the same room with other people and when you visit a healthcare provider. People who live with or visit you should also wear a facemask while they are in the same room with you.  Separate yourself from other people in your home As much as possible, you should stay in a different room from other people in your home. Also, you should use a separate bathroom, if available.  Avoid sharing household items You should not  share dishes, drinking glasses, cups, eating utensils, towels, bedding, or other items with other people in your home. After using these items, you should wash them thoroughly with soap and water.  Cover your coughs and sneezes Cover your mouth and nose with a tissue when you cough or sneeze, or you can cough or sneeze into your sleeve. Throw used tissues in a lined trash can, and immediately wash your hands with soap and water for at least 20 seconds or use an alcohol-based hand rub.  Wash your [employer] your hands often and thoroughly with soap and water for at least 20 seconds. You can use an alcohol-based hand sanitizer if soap and water are not available and if your hands are not visibly dirty. Avoid touching your eyes, nose, and mouth with unwashed hands.   Prevention Steps for Caregivers and Household Members of Individuals Confirmed to have, or Being Evaluated for, COVID-19 Infection Being Cared for in the Home  If you live with, or provide care at home for, a person confirmed to have, or being evaluated for, COVID-19 infection please follow these guidelines to prevent infection:  Follow healthcare provider's instructions Make sure that you understand and can help the patient follow any healthcare provider instructions for all care.  Provide for the patient's basic needs You should help the patient with basic needs in the home and provide support for getting groceries, prescriptions, and other personal needs.  Monitor the patient's symptoms If they are getting sicker, call his or her medical provider and tell them that the patient has, or is being evaluated for, COVID-19 infection. This will help the healthcare provider's office  take steps to keep other people from getting infected. Ask the healthcare provider to call the local or state health department.  Limit the number of people who have contact with the patient If possible, have only one caregiver for the  patient. Other household members should stay in another home or place of residence. If this is not possible, they should stay in another room, or be separated from the patient as much as possible. Use a separate bathroom, if available. Restrict visitors who do not have an essential need to be in the home.  Keep older adults, very young children, and other sick people away from the patient Keep older adults, very young children, and those who have compromised immune systems or chronic health conditions away from the patient. This includes people with chronic heart, lung, or kidney conditions, diabetes, and cancer.  Ensure good ventilation Make sure that shared spaces in the home have good air flow, such as from an air conditioner or an opened window, weather permitting.  Wash your hands often Wash your hands often and thoroughly with soap and water for at least 20 seconds. You can use an alcohol based hand sanitizer if soap and water are not available and if your hands are not visibly dirty. Avoid touching your eyes, nose, and mouth with unwashed hands. Use disposable paper towels to dry your hands. If not available, use dedicated cloth towels and replace them when they become wet.  Wear a facemask and gloves Wear a disposable facemask at all times in the room and gloves when you touch or have contact with the patient's blood, body fluids, and/or secretions or excretions, such as sweat, saliva, sputum, nasal mucus, vomit, urine, or feces.  Ensure the mask fits over your nose and mouth tightly, and do not touch it during use. Throw out disposable facemasks and gloves after using them. Do not reuse. Wash your hands immediately after removing your facemask and gloves. If your personal clothing becomes contaminated, carefully remove clothing and launder. Wash your hands after handling contaminated clothing. Place all used disposable facemasks, gloves, and other waste in a lined container before  disposing them with other household waste. Remove gloves and wash your hands immediately after handling these items.  Do not share dishes, glasses, or other household items with the patient Avoid sharing household items. You should not share dishes, drinking glasses, cups, eating utensils, towels, bedding, or other items with a patient who is confirmed to have, or being evaluated for, COVID-19 infection. After the person uses these items, you should wash them thoroughly with soap and water.  Wash laundry thoroughly Immediately remove and wash clothes or bedding that have blood, body fluids, and/or secretions or excretions, such as sweat, saliva, sputum, nasal mucus, vomit, urine, or feces, on them. Wear gloves when handling laundry from the patient. Read and follow directions on labels of laundry or clothing items and detergent. In general, wash and dry with the warmest temperatures recommended on the label.  Clean all areas the individual has used often Clean all touchable surfaces, such as counters, tabletops, doorknobs, bathroom fixtures, toilets, phones, keyboards, tablets, and bedside tables, every day. Also, clean any surfaces that may have blood, body fluids, and/or secretions or excretions on them. Wear gloves when cleaning surfaces the patient has come in contact with. Use a diluted bleach solution (e.g., dilute bleach with 1 part bleach and 10 parts water) or a household disinfectant with a label that says EPA-registered for coronaviruses. To make a bleach  solution at home, add 1 tablespoon of bleach to 1 quart (4 cups) of water. For a larger supply, add  cup of bleach to 1 gallon (16 cups) of water. Read labels of cleaning products and follow recommendations provided on product labels. Labels contain instructions for safe and effective use of the cleaning product including precautions you should take when applying the product, such as wearing gloves or eye protection and making sure you  have good ventilation during use of the product. Remove gloves and wash hands immediately after cleaning.  Monitor yourself for signs and symptoms of illness Caregivers and household members are considered close contacts, should monitor their health, and will be asked to limit movement outside of the home to the extent possible. Follow the monitoring steps for close contacts listed on the symptom monitoring form.   ? If you have additional questions, contact your local health department or call the epidemiologist on call at [phone] (available 24/7). ? This guidance is subject to change. For the most up-to-date guidance from [facility], please refer to their website: [email or web address]

## 2021-04-08 NOTE — ED Provider Notes (Signed)
[facility] EMERGENCY DEPARTMENT Provider Note   CSN: [id number] Arrival date & time: 04/07/21  1925     History Chief Complaint  Patient presents with   Dizziness    Jasmine Frye is a 70 y.o. female.   Dizziness Quality:  Room spinning Severity:  Severe Onset quality:  Sudden Duration:  1 day Timing:  Constant Progression:  Unchanged Chronicity:  New Context: not when bending over   Relieved by:  Nothing Worsened by:  Nothing Ineffective treatments:  None tried Associated symptoms: no chest pain, no headaches, no shortness of breath, no vision changes, no vomiting and no weakness   Risk factors: no hx of vertigo and no Meniere's disease   Patient with DM presents dizziness for one day with cough and congestion.  No f/c/r.      Past Medical History:  Diagnosis Date   Diabetes mellitus without complication (HCC)    Hypertension     Patient Active Problem List   Diagnosis Date Noted   Hypokalemia 12/06/2017   Sepsis due to pneumonia (HCC) 12/06/2017   Type 2 diabetes mellitus with other specified complication (HCC) 12/06/2017    Past Surgical History:  Procedure Laterality Date   ABDOMINAL HYSTERECTOMY       OB History     Gravida  3   Para  3   Term      Preterm      AB      Living         SAB      IAB      Ectopic      Multiple      Live Births              Family History  Problem Relation Age of Onset   Diabetes Mother    Heart attack Mother    Cancer Father    Aneurysm Sister    Pneumonia Brother    Diabetes Sister    Pneumonia Brother     Social History   Tobacco Use   Smoking status: Former   Smokeless tobacco: Never  Substance Use Topics   Alcohol use: Never   Drug use: Never    Home Medications Prior to Admission medications   Medication Sig Start Date End Date Taking? Authorizing Provider  gabapentin (NEURONTIN) 100 MG capsule Take 100 mg by mouth at bedtime. 10/10/20   [provider]   glipiZIDE (GLUCOTROL) 10 MG tablet Take 10 mg by mouth 2 (two) times daily before a meal.    [provider]  losartan (COZAAR) 100 MG tablet Take 100 mg by mouth daily.    [provider]  sitaGLIPtin-metformin (JANUMET) 50-1000 MG tablet Take 1 tablet by mouth 2 (two) times daily with a meal.    [provider]    Allergies    Patient has no known allergies.  Review of Systems   Review of Systems  Constitutional:  Negative for fever.  HENT:  Positive for congestion. Negative for facial swelling.   Eyes:  Negative for redness.  Respiratory:  Positive for cough. Negative for shortness of breath.   Cardiovascular:  Negative for chest pain.  Gastrointestinal:  Negative for abdominal pain and vomiting.  Genitourinary:  Negative for difficulty urinating.  Musculoskeletal:  Negative for neck stiffness.  Skin:  Negative for rash.  Neurological:  Positive for dizziness. Negative for facial asymmetry, weakness, light-headedness and headaches.  Psychiatric/Behavioral:  Negative for agitation.   All other systems reviewed and  are negative.  Physical Exam Updated Vital Signs BP 131/65   Pulse 83   Temp 99.3 F (37.4 C) (Oral)   Resp (!) 24   Ht 5\' 5"  (1.651 m)   Wt 79.4 kg   SpO2 94%   BMI 29.12 kg/m   Physical Exam Vitals and nursing note reviewed.  Constitutional:      General: She is not in acute distress.    Appearance: Normal appearance.  HENT:     Head: Normocephalic and atraumatic.     Nose: Nose normal.  Eyes:     Conjunctiva/sclera: Conjunctivae normal.     Pupils: Pupils are equal, round, and reactive to light.  Cardiovascular:     Rate and Rhythm: Normal rate and regular rhythm.     Pulses: Normal pulses.     Heart sounds: Normal heart sounds.  Pulmonary:     Effort: Pulmonary effort is normal.     Breath sounds: Normal breath sounds.  Abdominal:     General: Abdomen is flat. Bowel sounds are normal.     Palpations: Abdomen is soft.      Tenderness: There is no abdominal tenderness. There is no guarding.  Musculoskeletal:        General: Normal range of motion.     Cervical back: Normal range of motion and neck supple.  Skin:    General: Skin is warm and dry.  Neurological:     Mental Status: She is alert.     Deep Tendon Reflexes: Reflexes normal.  Psychiatric:        Mood and Affect: Mood normal.        Behavior: Behavior normal.    ED Results / Procedures / Treatments   Labs (all labs ordered are listed, but only abnormal results are displayed) Labs Reviewed  RESP PANEL BY RT-PCR (FLU A&B, COVID) ARPGX2 - Abnormal; Notable for the following components:      Result Value   SARS Coronavirus 2 by RT PCR POSITIVE (*)    All other components within normal limits  BASIC METABOLIC PANEL - Abnormal; Notable for the following components:   Sodium 133 (*)    Chloride 93 (*)    Glucose, Bld 217 (*)    Calcium 8.8 (*)    All other components within normal limits  CBC  URINALYSIS, ROUTINE W REFLEX MICROSCOPIC    EKG None  Radiology No results found.  Procedures Procedures   Medications Ordered in ED Medications  meclizine (ANTIVERT) tablet 25 mg (has no administration in time range)  iohexol (OMNIPAQUE) 350 MG/ML injection 75 mL (75 mLs Intravenous Contrast Given 04/08/21 0031)    ED Course  I have reviewed the triage vital signs and the nursing notes.  Pertinent labs & imaging results that were available during my care of the patient were reviewed by me and considered in my medical decision making (see chart for details).    MDM Rules/Calculators/A&P                            Symptoms consistent with covid 19.  No signs of cva on CTA.  Stable for discharge.  Will treat with meclizine.  Will add Molnupavir.   Jasmine Frye was evaluated in Emergency Department on 04/08/2021 for the symptoms described in the history of present illness. She was evaluated in the context of the global COVID-19 pandemic,  which necessitated consideration that the patient might be at risk for infection  with the SARS-CoV-2 virus that causes COVID-19. Institutional protocols and algorithms that pertain to the evaluation of patients at risk for COVID-19 are in a state of rapid change based on information released by regulatory bodies including the CDC and federal and state organizations. These policies and algorithms were followed during the patient's care in the ED.  Final Clinical Impression(s) / ED Diagnoses Final diagnoses:  COVID-19     Return for intractable cough, coughing up blood, fevers > 100.4 unrelieved by medication, shortness of breath, intractable vomiting, chest pain, shortness of breath, weakness, numbness, changes in speech, facial asymmetry, abdominal pain, passing out, Inability to tolerate liquids or food, cough, altered mental status or any concerns. No signs of systemic illness or infection. The patient is nontoxic-appearing on exam and vital signs are within normal limits. I have reviewed the triage vital signs and the nursing notes. Pertinent labs & imaging results that were available during my care of the patient were reviewed by me and considered in my medical decision making (see chart for details). After history, exam, and medical workup I feel the patient has been appropriately medically screened and is safe for discharge home. Pertinent diagnoses were discussed with the patient. Patient was given return precautions. Rx / DC Orders ED Discharge Orders     None        Nicklaus Alviar, MD 04/08/21 1791

## 2022-04-29 ENCOUNTER — Encounter: Payer: Self-pay | Admitting: Physical Medicine & Rehabilitation

## 2022-07-11 IMAGING — CT CT ANGIO HEAD
1 of 11 series · 6 of 33 positions shown · IV contrast (omnipaque)
Comparison: 10/19/2020 head CT

CLINICAL DATA: Dizziness

EXAM:
CT ANGIOGRAPHY HEAD AND NECK
TECHNIQUE: Multidetector CT imaging of the head and neck was performed using
the standard protocol during bolus administration of intravenous
contrast. Multiplanar CT image reconstructions and MIPs were
obtained to evaluate the vascular anatomy. Carotid stenosis
measurements (when applicable) are obtained utilizing NASCET
criteria, using the distal internal carotid diameter as the
denominator.
CONTRAST:  75mL OMNIPAQUE IOHEXOL 350 MG/ML SOLN

[Series 13: axial thin · axial · 0.39mm/px · z∈[-296,-42]mm · 6 of 357 slices shown]
[im 51/357  soft-tissue]
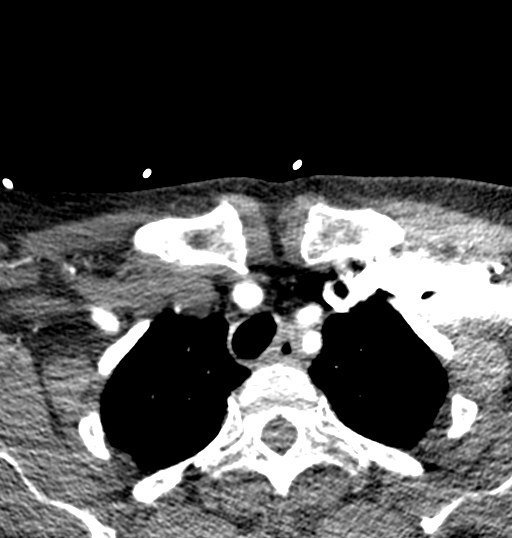
[im 102/357  bone]
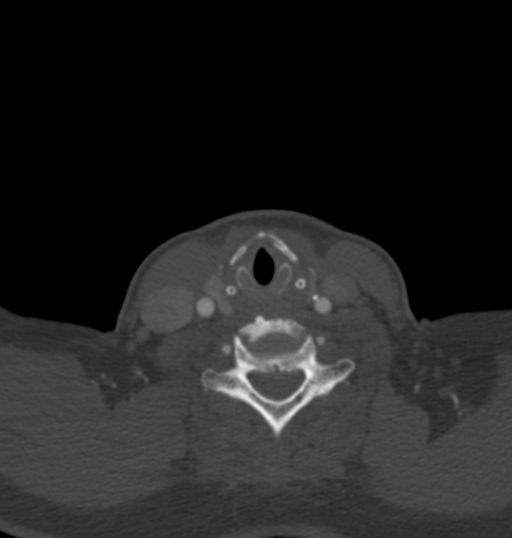
[im 153/357  soft-tissue]
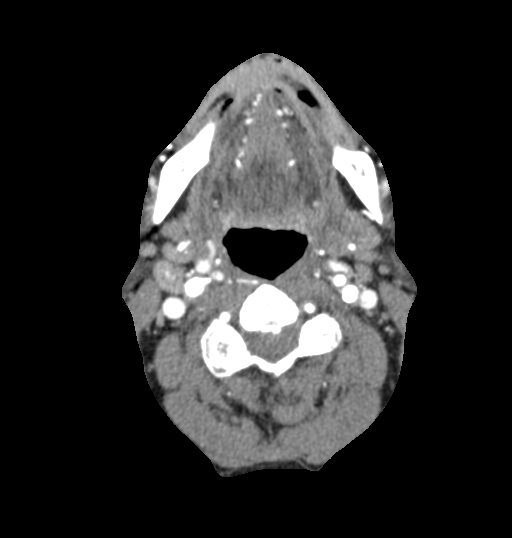
[im 204/357  bone]
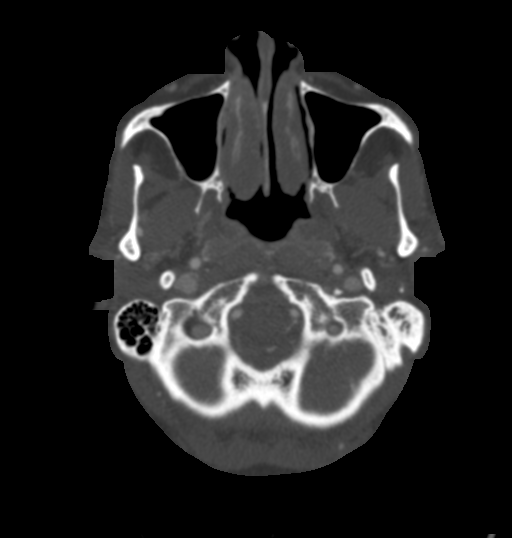
[im 255/357  soft-tissue]
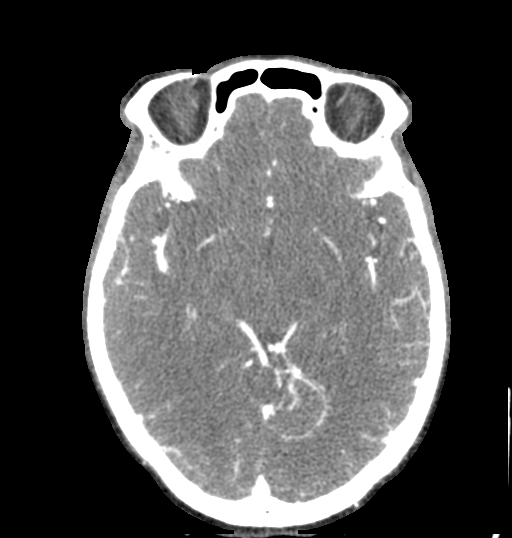
[im 306/357  bone]
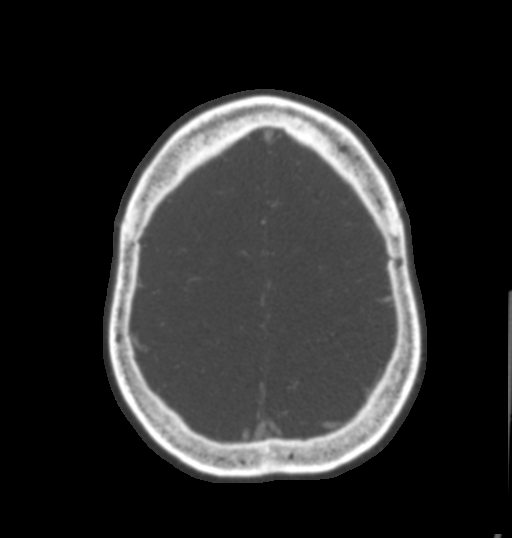

[6 of 33 positions shown; findings below may reference images not displayed]

FINDINGS: CT HEAD FINDINGS

Brain: There is no mass, hemorrhage or extra-axial collection. The
size and configuration of the ventricles and extra-axial CSF spaces
are normal. There is no acute or chronic infarction. The brain
parenchyma is normal.

Skull: The visualized skull base, calvarium and extracranial soft
tissues are normal.

Sinuses/Orbits: No fluid levels or advanced mucosal thickening of
the visualized paranasal sinuses. No mastoid or middle ear effusion.
The orbits are normal.

CTA NECK FINDINGS

SKELETON: There is no bony spinal canal stenosis. No lytic or
blastic lesion.

OTHER NECK: Normal pharynx, larynx and major salivary glands. No
cervical lymphadenopathy. Unremarkable thyroid gland.

UPPER CHEST: No pneumothorax or pleural effusion. No nodules or
masses.

AORTIC ARCH:

There is calcific atherosclerosis of the aortic arch. There is no
aneurysm, dissection or hemodynamically significant stenosis of the
visualized portion of the aorta. Conventional 3 vessel aortic
branching pattern. The visualized proximal subclavian arteries are
widely patent.

RIGHT CAROTID SYSTEM: No dissection, occlusion or aneurysm. Mild
atherosclerotic calcification at the carotid bifurcation without
hemodynamically significant stenosis.

LEFT CAROTID SYSTEM: No dissection, occlusion or aneurysm. Mild
atherosclerotic calcification at the carotid bifurcation without
hemodynamically significant stenosis.

VERTEBRAL ARTERIES: Left dominant configuration. Both origins are
clearly patent. There is no dissection, occlusion or flow-limiting
stenosis to the skull base (V1-V3 segments).

CTA HEAD FINDINGS

POSTERIOR CIRCULATION:

--Vertebral arteries: Normal V4 segments.

--Inferior cerebellar arteries: Normal.

--Basilar artery: Normal.

--Superior cerebellar arteries: Normal.

--Posterior cerebral arteries (PCA): Normal.

ANTERIOR CIRCULATION:

--Intracranial internal carotid arteries: Atherosclerotic
calcification of the internal carotid arteries at the skull base
without hemodynamically significant stenosis.

--Anterior cerebral arteries (ACA): Normal. Both A1 segments are
present. Patent anterior communicating artery (a-comm).

--Middle cerebral arteries (MCA): Normal.

VENOUS SINUSES: As permitted by contrast timing, patent.

ANATOMIC VARIANTS: None

Review of the MIP images confirms the above findings.
IMPRESSION: 1. No emergent large vessel occlusion or high-grade stenosis of the
head or neck.
2. Mild bilateral carotid bifurcation atherosclerosis without
hemodynamically significant stenosis by NASCET criteria.

Aortic Atherosclerosis ([id number]-IYL.L).

## 2022-08-05 ENCOUNTER — Encounter
Payer: No Typology Code available for payment source | Attending: Physical Medicine & Rehabilitation | Admitting: Physical Medicine & Rehabilitation

## 2022-08-05 ENCOUNTER — Encounter: Payer: Self-pay | Admitting: Physical Medicine & Rehabilitation

## 2022-08-05 VITALS — BP 143/78 | HR 69 | Ht 65.0 in | Wt 164.4 lb

## 2022-08-05 DIAGNOSIS — F0781 Postconcussional syndrome: Secondary | ICD-10-CM | POA: Diagnosis not present

## 2022-08-05 DIAGNOSIS — F4310 Post-traumatic stress disorder, unspecified: Secondary | ICD-10-CM | POA: Insufficient documentation

## 2022-08-05 DIAGNOSIS — H8110 Benign paroxysmal vertigo, unspecified ear: Secondary | ICD-10-CM | POA: Insufficient documentation

## 2022-08-05 MED ORDER — PRAZOSIN HCL 1 MG PO CAPS: 1.0000 mg | ORAL_CAPSULE | Freq: Every day | ORAL | 3 refills | Status: DC
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
## 2022-08-05 NOTE — Patient Instructions (Addendum)
ALWAYS FEEL FREE TO CALL OUR OFFICE WITH ANY PROBLEMS OR QUESTIONS [phone])  **PLEASE NOTE** ALL MEDICATION REFILL REQUESTS (INCLUDING CONTROLLED SUBSTANCES) NEED TO BE MADE AT LEAST 7 DAYS PRIOR TO REFILL BEING DUE. ANY REFILL REQUESTS INSIDE THAT TIME FRAME MAY RESULT IN DELAYS IN RECEIVING YOUR PRESCRIPTION.    CONTINUE WITH MELATONIN. YOU CAN TAKE UP TO 10MG  AT BEDTIME

## 2022-08-10 ENCOUNTER — Telehealth: Payer: Self-pay

## 2022-08-10 NOTE — Telephone Encounter (Signed)
Jasmine Frye, NCM for pt sent a fax stating "Per the 08/05/22 progress note, [name] is not ready to return to work. Please provide a separate work not I can provide her employer."

## 2022-09-17 ENCOUNTER — Telehealth: Payer: Self-pay | Admitting: Physical Medicine & Rehabilitation

## 2022-09-17 NOTE — Telephone Encounter (Signed)
[facility] with [facility] needs to speak to you about patient, patient having Neurologic Symptoms (saw patient last week).  Please call him at [phone].

## 2022-09-30 ENCOUNTER — Encounter
Payer: No Typology Code available for payment source | Attending: Physical Medicine & Rehabilitation | Admitting: Physical Medicine & Rehabilitation

## 2022-09-30 ENCOUNTER — Encounter: Payer: Self-pay | Admitting: Physical Medicine & Rehabilitation

## 2022-09-30 DIAGNOSIS — F0781 Postconcussional syndrome: Secondary | ICD-10-CM | POA: Insufficient documentation

## 2022-09-30 DIAGNOSIS — F4310 Post-traumatic stress disorder, unspecified: Secondary | ICD-10-CM | POA: Insufficient documentation

## 2022-09-30 MED ORDER — PRAZOSIN HCL 2 MG PO CAPS: 2.0000 mg | ORAL_CAPSULE | Freq: Every day | ORAL | 3 refills | Status: DC

## 2022-09-30 NOTE — Progress Notes (Signed)
Subjective:    Patient ID: Jasmine Frye, female    DOB: 04-06-1951, 72 y.o.   MRN: [id number]  HPI  Jasmine Frye is here in follow up of her postconcussion syndrome.  She reports that she still is having nightmares and difficulty sleeping.  She actually showed me a video of her self tossing and turning in bed.  Unfortunately she chose to only take the prazosin as needed at bedtime.  She is using melatonin scheduled.  She feels tired when she wakes up in the morning and reports nightmares and vivid dreams still.  She did get into physical therapy with [facility].  They are starting some vestibular exercises with her and balance training.  Sounds as if they have not isolated any specific nystagmus or BPPV as of yet.  I asked the patient if she felt that her dizziness was more vertigo like versus being startled or anxious.  She states that the room definitely spins on her when her vertigo since then.  She remains out of work.  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  Worker's Comp. Tourist information centre manager provided me some paperwork regarding prior imaging.  There is a lumbar MRI which shows some mild spondylosis and degenerative disc disease.  No obvious source of right-sided lumbar radiculopathy seen.  There is also reports from therapy as well as a procedure report for cervical epidurals which were done previously.  There is no MRI of the neck in the packet.  Pain Inventory Average Pain 8 Pain Right Now 2 My pain is constant, sharp, stabbing, tingling, and aching  In the last 24 hours, has pain interfered with the following? General activity 3 Relation with others 3 Enjoyment of life 3 What TIME of day is your pain at its worst? varies Sleep (in general) Poor  Pain is worse with: walking, bending, sitting, inactivity, standing, unsure, and some activites Pain improves with: rest and medication Relief from Meds: 5  Family History  Problem Relation Age of Onset   Diabetes Mother     Heart attack Mother    Cancer Father    Aneurysm Sister    Pneumonia Brother    Diabetes Sister    Pneumonia Brother    Social History   Socioeconomic History   Marital status: Married    Spouse name: Not on file   Number of children: Not on file   Years of education: Not on file   Highest education level: Not on file  Occupational History   Not on file  Tobacco Use   Smoking status: Former   Smokeless tobacco: Never  Scientific laboratory technician Use: Not on file  Substance and Sexual Activity   Alcohol use: Never   Drug use: Never   Sexual activity: Yes  Other Topics Concern   Not on file  Social History Narrative   Not on file   Social Determinants of Health   Financial Resource Strain: Not on file  Food Insecurity: Not on file  Transportation Needs: Not on file  Physical Activity: Not on file  Stress: Not on file  Social Connections: Not on file   Past Surgical History:  Procedure Laterality Date   ABDOMINAL HYSTERECTOMY     Past Surgical History:  Procedure Laterality Date   ABDOMINAL HYSTERECTOMY     Past Medical History:  Diagnosis Date   Diabetes mellitus without complication ([place])    Hypertension    BP 139/72   Pulse 71   Ht  5\' 5"  (1.651 m)   Wt 165 lb (74.8 kg)   SpO2 95%   BMI 27.46 kg/m   Opioid Risk Score:   Fall Risk Score:  `1  Depression screen [facility] 2/9     09/30/2022    1:32 PM 08/05/2022    9:09 AM  Depression screen PHQ 2/9  Decreased Interest 1 3  Down, Depressed, Hopeless 1 1  PHQ - 2 Score 2 4  Altered sleeping  2  Tired, decreased energy  1  Change in appetite  3  Feeling bad or failure about yourself   0  Trouble concentrating  1  Moving slowly or fidgety/restless  1  Suicidal thoughts  0  PHQ-9 Score  12  Difficult doing work/chores  Somewhat difficult     Review of Systems  Musculoskeletal:  Positive for neck pain.  Neurological:  Positive for tremors.  All other systems reviewed and are negative.      Objective:   Physical Exam  Constitutional: No distress . Vital signs reviewed. HEENT: NCAT, EOMI, oral membranes moist Neck: supple Cardiovascular: RRR without murmur. No JVD    Respiratory/Chest: CTA Bilaterally without wheezes or rales. Normal effort    GI/Abdomen: BS +, non-tender, non-distended Ext: no clubbing, cyanosis, or edema Psych: pleasant and cooperative  Skin: intact Neuro: Alert and oriented x 3. Normal insight and awareness. Intact Memory. Normal language and speech. Cognitively fairly appropriate today. Cranial nerve exam unremarkable. Did not perform vestibular testing today.    Musculoskeletal: neck rotation limited right more than left. Flexion limited also. Antalgic on right knee due to old injury.             Assessment & Plan:  Postconcussion syndrome with ongoing vertigo, mild diplopia, PTSD sx.  Cervicalgia       Plan:  1.[facility] at [facility]. They've had some difficulties finding nystagmus apparently. She will come back when symptoms worsen again. In the meantime she will work on the exercises provided. -off meclizine and gabapentin Increase to prazosin to 2mg  qhs for nightmares and PTSD. She needs to take scheduled!!! Would benefit from neuropsych counseling for anxiety and PTSD symptoms as well.  Patient has a visit pending with Dr. Sima Matas next week to discuss the above symptoms and work on Radiographer, therapeutic. - continue with melatonin for sleep up to 10mg  Cervical management per other provider She is not ready to return to work.      30  minutes of face to face patient care time were spent during this visit. All questions were encouraged and answered. Follow up with me in 2 months.  Asked patient to make sure that she contact our office with any concerns or problems and certainly before she makes any changes to her medications.

## 2022-09-30 NOTE — Patient Instructions (Addendum)
FOR YOUR NIGHTMARES WE WILL INCREASE PRAZOSIN TO 2MG  AT BEDTIME. YOU MAY TAKE IT WITH MELATONIN.  YOU NEED TO TAKE IT EVERY NIGHT.  IF YOU HAVE ANY PROBLEMS WITH IT, GIVE ME A CALL.  IF IT'S NOT HELPING AFTER A WEEK OR SO, GIVE ME A CALL.    ALWAYS FEEL FREE TO CALL OUR OFFICE WITH ANY PROBLEMS OR QUESTIONS ([phone])  **PLEASE NOTE** ALL MEDICATION REFILL REQUESTS (INCLUDING CONTROLLED SUBSTANCES) NEED TO BE MADE AT LEAST 7 DAYS PRIOR TO REFILL BEING DUE. ANY REFILL REQUESTS INSIDE THAT TIME FRAME MAY RESULT IN DELAYS IN RECEIVING YOUR PRESCRIPTION.

## 2022-10-07 ENCOUNTER — Encounter: Payer: No Typology Code available for payment source | Admitting: Psychology

## 2022-10-07 DIAGNOSIS — G4752 REM sleep behavior disorder: Secondary | ICD-10-CM

## 2022-10-07 DIAGNOSIS — F4310 Post-traumatic stress disorder, unspecified: Secondary | ICD-10-CM | POA: Diagnosis not present

## 2022-10-07 DIAGNOSIS — F0781 Postconcussional syndrome: Secondary | ICD-10-CM | POA: Diagnosis not present

## 2022-11-11 ENCOUNTER — Encounter: Payer: TRICARE For Life (TFL) | Attending: Psychology | Admitting: Psychology

## 2022-11-12 ENCOUNTER — Encounter: Payer: Self-pay | Admitting: Psychology
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
## 2022-12-23 ENCOUNTER — Encounter: Payer: Self-pay | Admitting: Physical Medicine & Rehabilitation

## 2022-12-23 ENCOUNTER — Encounter
Payer: No Typology Code available for payment source | Attending: Physical Medicine & Rehabilitation | Admitting: Physical Medicine & Rehabilitation

## 2022-12-23 DIAGNOSIS — F0781 Postconcussional syndrome: Secondary | ICD-10-CM | POA: Insufficient documentation

## 2022-12-23 DIAGNOSIS — F4310 Post-traumatic stress disorder, unspecified: Secondary | ICD-10-CM | POA: Diagnosis not present

## 2022-12-23 MED ORDER — ESCITALOPRAM OXALATE 5 MG PO TABS: 5.0000 mg | ORAL_TABLET | Freq: Every day | ORAL | 4 refills | Status: DC

## 2022-12-23 MED ORDER — PRAZOSIN HCL 2 MG PO CAPS: 4.0000 mg | ORAL_CAPSULE | Freq: Every day | ORAL | 5 refills | Status: DC

## 2022-12-23 NOTE — Patient Instructions (Signed)
ALWAYS FEEL FREE TO CALL OUR OFFICE WITH ANY PROBLEMS OR QUESTIONS [phone])  **PLEASE NOTE** ALL MEDICATION REFILL REQUESTS (INCLUDING CONTROLLED SUBSTANCES) NEED TO BE MADE AT LEAST 7 DAYS PRIOR TO REFILL BEING DUE. ANY REFILL REQUESTS INSIDE THAT TIME FRAME MAY RESULT IN DELAYS IN RECEIVING YOUR PRESCRIPTION.                   CONTINUE WITH YOUR EYE/GAZE EXERCISES TWICE DAILY!!!

## 2022-12-23 NOTE — Progress Notes (Signed)
Subjective:    Patient ID: Jasmine Frye, female    DOB: October 17, 1950, 72 y.o.   MRN: [id number]  HPI  Jasmine Frye is here in follow-up of her postconcussion syndrome.  I last saw her on September 30, 2022.  She was able to see Jasmine Frye on January 31st 2024 for her PTSD and sleep and emotional issues.  Future visits were arranged. They discussed sleep habits and ways to cope with some of sleep issues, mood and nightmares. Her nights have been better now that her husband hasn't been waking her up when she is having a nightmare.    She fainted while she was up at sink in kitchen about two weeks ago. She may have been dehydrated.   She has had good results with the vestibular therapy as her vertigo has diminished. She still struggles with vertical gaze. She is not doing home exercises daily however.   Mood still can be anxious and depressed. She's on remeron.    Jasmine Frye reports that she had 2 shots in her lower back for back and right leg pain.   Pain Inventory Average Pain 0 Pain Right Now 0 My pain is constant and tingling  LOCATION OF PAIN  right side  BOWEL Number of stools per week: 2-3 Oral laxative use No   BLADDER Normal  Mobility walk without assistance ability to climb steps?  yes do you drive?  yes  Function disabled: date disabled temporary - wkmn comp  Neuro/Psych weakness numbness tremor tingling trouble walking spasms dizziness confusion depression anxiety  Prior Studies Any changes since last visit?  no  Physicians involved in your care Any changes since last visit?  no   Family History  Problem Relation Age of Onset   Diabetes Mother    Heart attack Mother    Cancer Father    Aneurysm Sister    Pneumonia Brother    Diabetes Sister    Pneumonia Brother    Social History   Socioeconomic History   Marital status: Married    Spouse name: Not on file   Number of children: Not on file   Years of education: Not on file   Highest  education level: Not on file  Occupational History   Not on file  Tobacco Use   Smoking status: Former   Smokeless tobacco: Never  Building services engineer Use: Not on file  Substance and Sexual Activity   Alcohol use: Never   Drug use: Never   Sexual activity: Yes  Other Topics Concern   Not on file  Social History Narrative   Not on file   Social Determinants of Health   Financial Resource Strain: Not on file  Food Insecurity: Not on file  Transportation Needs: Not on file  Physical Activity: Not on file  Stress: Not on file  Social Connections: Not on file   Past Surgical History:  Procedure Laterality Date   ABDOMINAL HYSTERECTOMY     Past Medical History:  Diagnosis Date   Diabetes mellitus without complication    Hypertension    BP 128/80   Pulse 69   Ht  (1.651 m)   Wt 173 lb 3.2 oz (78.6 kg)   SpO2 95%   BMI 28.82 kg/m   Opioid Risk Score:   Fall Risk Score:  `1  Depression screen PHQ 2/9     12/23/2022    1:16 PM 09/30/2022    1:32 PM 08/05/2022    9:09 AM  Depression screen PHQ 2/9  Decreased Interest Down, Depressed, Hopeless PHQ - 2 Score Altered sleeping   2  Tired, decreased energy   1  Change in appetite   3  Feeling bad or failure about yourself    0  Trouble concentrating   1  Moving slowly or fidgety/restless   1  Suicidal thoughts   0  PHQ-9 Score   12  Difficult doing work/chores   Somewhat difficult     Review of Systems  Constitutional: Negative.   HENT: Negative.    Eyes: Negative.   Respiratory: Negative.    Cardiovascular: Negative.   Gastrointestinal: Negative.   Endocrine: Negative.   Genitourinary: Negative.   Musculoskeletal:  Positive for back pain.       Spasms, had back injections (2) last Tuesday 4/9  Skin: Negative.   Allergic/Immunologic: Negative.   Neurological:  Positive for dizziness, tremors, weakness and numbness.       Tingling  Hematological: Negative.    Psychiatric/Behavioral:  Positive for dysphoric mood. The patient is nervous/anxious.   All other systems reviewed and are negative.      Objective:   Physical Exam  General: No acute distress HEENT: NCAT, EOMI, oral membranes moist Cards: reg rate  Chest: normal effort Abdomen: Soft, NT, ND Skin: dry, intact Extremities: no edema Psych: pleasant and appropriate. Much more up beat and attentive  Skin: intact Neuro: Alert and oriented x 3. Normal insight and awareness. Intact Memory. Normal language and speech. Cognitively fairly appropriate today. Cranial nerve exam unremarkable. Balance appears improved   Musculoskeletal: neck rotation limited right more than left. Flexion limited also. Antalgic on right knee due to old injury.  no change           Assessment & Plan:  Postconcussion syndrome with ongoing vertigo, mild diplopia, PTSD sx.  Cervicalgia       Plan:  1.Continue with vestibular therapies at breakthrough. She needs to keep up with HEP  -making progress!  2. Increase to prazosin to  qhs for nightmares and PTSD. She is taking scheduled -dc remeron and begin trial of lexapro  qhs to address anxiety/depression 3 Continue with neuropsych counseling for anxiety and PTSD symptoms with Jasmine Frye next week to discuss the above symptoms and work on Pharmacologist. - continue with melatonin for sleep up to  nightly 4 Cervical/lumbar management per other provider 5. She is unable to return to bus driving.      20  minutes of face to face patient care time were spent during this visit. All questions were encouraged and answered. Follow up with me in 4months.  Asked patient to make sure that she contact our office with any concerns or problems and certainly before she makes any changes to her medications.

## 2022-12-23 NOTE — Progress Notes (Deleted)
   Subjective:    Patient ID: Jasmine Frye, female    DOB: 1950/11/02, 72 y.o.   MRN: [id number]  HPI  Pain Inventory Average Pain 0 Pain Right Now 0 My pain is constant and tingling  In the last 24 hours, has pain interfered with the following? General activity 0 Relation with others {NUMBERS; 0-10:5044} Enjoyment of life {NUMBERS; 0-10:5044} What TIME of day is your pain at its worst? {time of day:24191} Sleep (in general) {BHH GOOD/FAIR/POOR:22877}  Pain is worse with: {ACTIVITIES:21022942} Pain improves with: {PAIN IMPROVES QBHA:19379024} Relief from Meds: {NUMBERS; 0-10:5044}  {MOBILITY OXB:35329924}  {FUNCTION:21022946}  {NEURO/PSYCH:21022948}  {CPRM PRIOR STUDIES:21022953}  {CPRM PHYSICIANS INVOLVED IN YOUR CARE:21022954}    Family History  Problem Relation Age of Onset   Diabetes Mother    Heart attack Mother    Cancer Father    Aneurysm Sister    Pneumonia Brother    Diabetes Sister    Pneumonia Brother    Social History   Socioeconomic History   Marital status: Married    Spouse name: Not on file   Number of children: Not on file   Years of education: Not on file   Highest education level: Not on file  Occupational History   Not on file  Tobacco Use   Smoking status: Former   Smokeless tobacco: Never  Building services engineer Use: Not on file  Substance and Sexual Activity   Alcohol use: Never   Drug use: Never   Sexual activity: Yes  Other Topics Concern   Not on file  Social History Narrative   Not on file   Social Determinants of Health   Financial Resource Strain: Not on file  Food Insecurity: Not on file  Transportation Needs: Not on file  Physical Activity: Not on file  Stress: Not on file  Social Connections: Not on file   Past Surgical History:  Procedure Laterality Date   ABDOMINAL HYSTERECTOMY     Past Medical History:  Diagnosis Date   Diabetes mellitus without complication    Hypertension    BP 128/80   Pulse 69    Ht  (1.651 m)   Wt 173 lb 3.2 oz (78.6 kg)   SpO2 95%   BMI 28.82 kg/m   Opioid Risk Score:   Fall Risk Score:  `1  Depression screen PHQ 2/9     12/23/2022    1:16 PM 09/30/2022    1:32 PM 08/05/2022    9:09 AM  Depression screen PHQ 2/9  Decreased Interest Down, Depressed, Hopeless PHQ - 2 Score Altered sleeping   2  Tired, decreased energy   1  Change in appetite   3  Feeling bad or failure about yourself    0  Trouble concentrating   1  Moving slowly or fidgety/restless   1  Suicidal thoughts   0  PHQ-9 Score   12  Difficult doing work/chores   Somewhat difficult     Review of Systems     Objective:   Physical Exam        Assessment & Plan:

## 2022-12-31 ENCOUNTER — Telehealth: Payer: Self-pay | Admitting: Physical Medicine & Rehabilitation

## 2022-12-31 DIAGNOSIS — H8110 Benign paroxysmal vertigo, unspecified ear: Secondary | ICD-10-CM

## 2022-12-31 DIAGNOSIS — F0781 Postconcussional syndrome: Secondary | ICD-10-CM

## 2022-12-31 NOTE — Telephone Encounter (Signed)
Jasmine Frye Case manager is requesting a work note for patient so she can provide to pt employer. And also requesting an updated order for vestibular therapy

## 2023-01-01 NOTE — Addendum Note (Signed)
Addended by: Faith Rogue T on: 01/01/2023 09:09 AM   Modules accepted: Orders

## 2023-01-01 NOTE — Telephone Encounter (Signed)
Order for external PT/vestibular therapy created.   I wrote letter and it's in chart. There is obviously no signature on the letter if they need it before Wednesday. Perhaps someone can stamp my signature on. (Do we have a stamp?)

## 2023-01-26 ENCOUNTER — Encounter: Payer: No Typology Code available for payment source | Attending: Psychology | Admitting: Psychology

## 2023-01-26 DIAGNOSIS — H8110 Benign paroxysmal vertigo, unspecified ear: Secondary | ICD-10-CM | POA: Insufficient documentation

## 2023-01-26 DIAGNOSIS — S069XAS Unspecified intracranial injury with loss of consciousness status unknown, sequela: Secondary | ICD-10-CM | POA: Diagnosis not present

## 2023-01-26 DIAGNOSIS — F0670 Mild neurocognitive disorder due to known physiological condition without behavioral disturbance: Secondary | ICD-10-CM | POA: Insufficient documentation

## 2023-01-26 DIAGNOSIS — F4310 Post-traumatic stress disorder, unspecified: Secondary | ICD-10-CM | POA: Diagnosis not present

## 2023-01-26 DIAGNOSIS — G4752 REM sleep behavior disorder: Secondary | ICD-10-CM | POA: Insufficient documentation

## 2023-01-27 ENCOUNTER — Encounter: Payer: Self-pay | Admitting: Psychology
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
## 2023-02-16 ENCOUNTER — Other Ambulatory Visit: Payer: Self-pay | Admitting: Physical Medicine & Rehabilitation

## 2023-02-16 DIAGNOSIS — F0781 Postconcussional syndrome: Secondary | ICD-10-CM

## 2023-02-16 DIAGNOSIS — F4310 Post-traumatic stress disorder, unspecified: Secondary | ICD-10-CM

## 2023-03-04 ENCOUNTER — Encounter: Payer: Self-pay | Admitting: Psychology

## 2023-03-04 ENCOUNTER — Encounter: Payer: No Typology Code available for payment source | Attending: Psychology | Admitting: Psychology

## 2023-03-04 DIAGNOSIS — F0781 Postconcussional syndrome: Secondary | ICD-10-CM

## 2023-03-04 DIAGNOSIS — F4310 Post-traumatic stress disorder, unspecified: Secondary | ICD-10-CM

## 2023-03-04 DIAGNOSIS — H8110 Benign paroxysmal vertigo, unspecified ear: Secondary | ICD-10-CM | POA: Diagnosis not present

## 2023-03-04 DIAGNOSIS — S069XAS Unspecified intracranial injury with loss of consciousness status unknown, sequela: Secondary | ICD-10-CM | POA: Diagnosis not present

## 2023-03-04 DIAGNOSIS — F0670 Mild neurocognitive disorder due to known physiological condition without behavioral disturbance: Secondary | ICD-10-CM | POA: Diagnosis present

## 2023-03-04 DIAGNOSIS — G4752 REM sleep behavior disorder: Secondary | ICD-10-CM | POA: Diagnosis not present
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
## 2023-03-25 ENCOUNTER — Emergency Department (HOSPITAL_BASED_OUTPATIENT_CLINIC_OR_DEPARTMENT_OTHER)
Admission: EM | Admit: 2023-03-25 | Discharge: 2023-03-25 | Disposition: A | Discharge status: Home / Self Care | Payer: Medicare Other | Attending: Emergency Medicine | Admitting: Emergency Medicine

## 2023-03-25 ENCOUNTER — Other Ambulatory Visit: Payer: Self-pay

## 2023-03-25 ENCOUNTER — Encounter (HOSPITAL_BASED_OUTPATIENT_CLINIC_OR_DEPARTMENT_OTHER): Payer: Self-pay | Admitting: Emergency Medicine

## 2023-03-25 DIAGNOSIS — Z79899 Other long term (current) drug therapy: Secondary | ICD-10-CM | POA: Diagnosis not present

## 2023-03-25 DIAGNOSIS — I10 Essential (primary) hypertension: Secondary | ICD-10-CM | POA: Insufficient documentation

## 2023-03-25 DIAGNOSIS — R739 Hyperglycemia, unspecified: Secondary | ICD-10-CM | POA: Diagnosis present

## 2023-03-25 DIAGNOSIS — E1165 Type 2 diabetes mellitus with hyperglycemia: Secondary | ICD-10-CM | POA: Diagnosis not present

## 2023-03-25 DIAGNOSIS — Z7984 Long term (current) use of oral hypoglycemic drugs: Secondary | ICD-10-CM | POA: Diagnosis not present

## 2023-03-25 LAB — CBC WITH DIFFERENTIAL/PLATELET
Abs Immature Granulocytes: 0.01 10*3/uL (ref 0.00–0.07)
Basophils Absolute: 0 10*3/uL (ref 0.0–0.1)
Basophils Relative: 1 %
Eosinophils Absolute: 0.1 10*3/uL (ref 0.0–0.5)
Eosinophils Relative: 2 %
HCT: 37.3 % (ref 36.0–46.0)
Hemoglobin: 12 g/dL (ref 12.0–15.0)
Immature Granulocytes: 0 %
Lymphocytes Relative: 38 %
Lymphs Abs: 2 10*3/uL (ref 0.7–4.0)
MCH: 26.6 pg (ref 26.0–34.0)
MCHC: 32.2 g/dL (ref 30.0–36.0)
MCV: 82.7 fL (ref 80.0–100.0)
Monocytes Absolute: 0.4 10*3/uL (ref 0.1–1.0)
Monocytes Relative: 7 %
Neutro Abs: 2.7 10*3/uL (ref 1.7–7.7)
Neutrophils Relative %: 52 %
Platelets: 238 10*3/uL (ref 150–400)
RBC: 4.51 MIL/uL (ref 3.87–5.11)
RDW: 14 % (ref 11.5–15.5)
WBC: 5.3 10*3/uL (ref 4.0–10.5)
nRBC: 0 % (ref 0.0–0.2)

## 2023-03-25 LAB — COMPREHENSIVE METABOLIC PANEL
ALT: 13 U/L (ref 0–44)
AST: 21 U/L (ref 15–41)
Albumin: 3.9 g/dL (ref 3.5–5.0)
Alkaline Phosphatase: 62 U/L (ref 38–126)
Anion gap: 11 (ref 5–15)
BUN: 17 mg/dL (ref 8–23)
CO2: 23 mmol/L (ref 22–32)
Calcium: 8.6 mg/dL — ABNORMAL LOW (ref 8.9–10.3)
Chloride: 101 mmol/L (ref 98–111)
Creatinine, Ser: 0.66 mg/dL (ref 0.44–1.00)
GFR, Estimated: >60 mL/min (ref >60)
Glucose, Bld: 212 mg/dL — ABNORMAL HIGH (ref 70–99)
Potassium: 4.3 mmol/L (ref 3.5–5.1)
Sodium: 135 mmol/L (ref 135–145)
Total Bilirubin: 1.2 mg/dL (ref 0.3–1.2)
Total Protein: 7.1 g/dL (ref 6.5–8.1)

## 2023-03-25 LAB — CBG MONITORING, ED: Glucose-Capillary: 190 mg/dL — ABNORMAL HIGH (ref 70–99)

## 2023-03-25 MED ORDER — SODIUM CHLORIDE 0.9 % IV BOLUS
1000.0000 mL | Freq: Once | INTRAVENOUS | Status: AC
  Administered 2023-03-25: 1000 mL via INTRAVENOUS

## 2023-03-25 NOTE — ED Triage Notes (Addendum)
Pt presents to ED POV. Pt c/o hypotension this morning and hyperglycemia since yesterday. Pt also c/o dizziness for a few days.

## 2023-03-25 NOTE — Discharge Instructions (Signed)
Follow-up with your primary care doctor to continue blood sugar management.  These take your time when changing positions to help avoid any low blood pressures.

## 2023-03-25 NOTE — ED Provider Notes (Signed)
Jasmine Frye Provider Note   CSN: [id number] Arrival date & time: 03/25/23  1128     History  Chief Complaint  Patient presents with   Hypotension   Hyperglycemia    Jasmine Frye is a 72 y.o. female.  Patient is here with high blood sugar, lightheaded episode.  She is now asymptomatic.  Blood sugars been high the last few days.  She has been doing a lot of work outside.  She thinks she might be dehydrated.  Some dizziness when she stands and walks.  She has a history of vertigo.  She denies any weakness numbness or vision or vision loss.  No chest pain or shortness of breath.  History of diabetes and hypertension otherwise.  She has been able to ambulate now without much issues.  She denies any nausea vomiting diarrhea.  The history is provided by the patient.       Home Medications Prior to Admission medications   Medication Sig Start Date End Date Taking? Authorizing Provider  cyclobenzaprine (FLEXERIL) 10 MG tablet Take by mouth. Patient not taking: Reported on 09/30/2022 11/18/15   [provider]  escitalopram (LEXAPRO) 5 MG tablet Take 1 tablet (5 mg total) by mouth at bedtime. 12/23/22   Ranelle Oyster, MD  gabapentin (NEURONTIN) 100 MG capsule Take by mouth. 08/20/22   [provider]  glipiZIDE (GLUCOTROL) 10 MG tablet Take 10 mg by mouth 2 (two) times daily before a meal.    [provider]  losartan-hydrochlorothiazide (HYZAAR) 100-25 MG tablet Take 1 tablet by mouth every morning. 03/07/22   [provider]  meclizine (ANTIVERT) 12.5 MG tablet Take 1 tablet (12.5 mg total) by mouth 3 (three) times daily as needed for dizziness. 04/08/21   Palumbo, April, MD  naproxen (NAPROSYN) 500 MG tablet  03/25/21   [provider]  pioglitazone (ACTOS) 15 MG tablet  04/11/21   [provider]  pioglitazone (ACTOS) 45 MG tablet Take 45 mg by mouth daily. Patient not taking: Reported on  09/30/2022 08/10/22   [provider]  prazosin (MINIPRESS) 2 MG capsule Take 2 capsules (4 mg total) by mouth at bedtime. 12/23/22   Ranelle Oyster, MD  SEMAGLUTIDE PO Take by mouth. Patient not taking: Reported on 09/30/2022    [provider]  sitaGLIPtin (JANUVIA) 100 MG tablet Take by mouth. Patient not taking: Reported on 09/30/2022    [provider]  sitaGLIPtin-metformin (JANUMET) 50-1000 MG tablet Take 1 tablet by mouth 2 (two) times daily with a meal. Patient not taking: Reported on 09/30/2022    [provider]  traMADol Janean Sark) 50 MG tablet  03/26/21   [provider]  Vitamin D, Ergocalciferol, (DRISDOL) 1.25 MG (50000 UNIT) CAPS capsule TAKE 1 CAPSULE BY MOUTH 1 TIME WEEKLY 03/12/22   [provider]      Allergies    Patient has no known allergies.    Review of Systems   Review of Systems  Physical Exam Updated Vital Signs BP (!) 161/78   Pulse 84   Temp (!) 97.1 F (36.2 C)   Resp 18   SpO2 97%  Physical Exam Vitals and nursing note reviewed.  Constitutional:      General: She is not in acute distress.    Appearance: She is well-developed. She is not ill-appearing.  HENT:     Head: Normocephalic and atraumatic.     Nose: Nose normal.     Mouth/Throat:  Mouth: Mucous membranes are moist.  Eyes:     Extraocular Movements: Extraocular movements intact.     Conjunctiva/sclera: Conjunctivae normal.     Pupils: Pupils are equal, round, and reactive to light.  Cardiovascular:     Rate and Rhythm: Normal rate and regular rhythm.     Pulses: Normal pulses.     Heart sounds: Normal heart sounds. No murmur heard. Pulmonary:     Effort: Pulmonary effort is normal. No respiratory distress.     Breath sounds: Normal breath sounds.  Abdominal:     Palpations: Abdomen is soft.     Tenderness: There is no abdominal tenderness.  Musculoskeletal:        General: No swelling.     Cervical back: Normal range of motion  and neck supple.  Skin:    General: Skin is warm and dry.     Capillary Refill: Capillary refill takes less than 2 seconds.  Neurological:     General: No focal deficit present.     Mental Status: She is alert and oriented to person, place, and time.     Cranial Nerves: No cranial nerve deficit.     Sensory: No sensory deficit.     Motor: No weakness.     Coordination: Coordination normal.     Gait: Gait normal.     Comments: 5+ out of 5 strength throughout, normal sensation, no drift, normal finger-nose-finger, normal speech, able to ambulate without any difficulties.  Psychiatric:        Mood and Affect: Mood normal.     ED Results / Procedures / Treatments   Labs (all labs ordered are listed, but only abnormal results are displayed) Labs Reviewed  COMPREHENSIVE METABOLIC PANEL - Abnormal; Notable for the following components:      Result Value   Glucose, Bld 212 (*)    Calcium 8.6 (*)    All other components within normal limits  CBG MONITORING, ED - Abnormal; Notable for the following components:   Glucose-Capillary 190 (*)    All other components within normal limits  CBC WITH DIFFERENTIAL/PLATELET    EKG EKG Interpretation Date/Time:  Thursday March 25 2023 12:57:43 EDT Ventricular Rate:  76 PR Interval:  149 QRS Duration:  100 QT Interval:  422 QTC Calculation: 475 R Axis:   47  Text Interpretation: Sinus rhythm Confirmed by Virgina Norfolk [phone]) on 03/25/2023 1:08:32 PM  Radiology No results found.  Procedures Procedures    Medications Ordered in ED Medications  sodium chloride 0.9 % bolus 1,000 mL (0 mLs Intravenous Stopped 03/25/23 1407)    ED Course/ Medical Decision Making/ A&P                             Medical Decision Making Amount and/or Complexity of Data Reviewed Labs: ordered.   Jasmine Frye is here with lightheadedness and high blood sugar may be an episode of low blood pressure.  She has a history of diabetes and hypertension.  She  arrives unremarkable vitals.  She is mildly hypertensive.  Blood sugar upon arrival is mildly elevated in the 190s.  She is neurologically intact.  She is ambulatory without much issues.  She denies any chest pain or shortness of breath.  She has been doing a lot of outside work here recently.  Think she may be dehydrated.  Differential diagnosis likely dehydration versus some mild vertigo versus electrolyte abnormality.  Have no concern for stroke or  cardiac or pulmonary process at this time.  Will give IV fluids and check basic labs.  EKG shows sinus rhythm.  No ischemic changes.  No significant electrolyte abnormality except for mild hyperglycemia 212.  She is not in DKA.  Lab work otherwise unremarkable.  She is feeling better after IV fluids.  She is ambulatory.  Discharged in good condition.  Recommend close follow-up with primary care doctor for further blood sugar management.  Understands questions.  This chart was dictated using voice recognition software.  Despite best efforts to proofread,  errors can occur which can change the documentation meaning.         Final Clinical Impression(s) / ED Diagnoses Final diagnoses:  Hyperglycemia    Rx / DC Orders ED Discharge Orders     None         Virgina Norfolk, DO 03/25/23 1444

## 2023-04-21 ENCOUNTER — Encounter
Payer: No Typology Code available for payment source | Attending: Psychology | Admitting: Physical Medicine & Rehabilitation

## 2023-04-21 ENCOUNTER — Encounter: Payer: Self-pay | Admitting: Physical Medicine & Rehabilitation

## 2023-04-21 DIAGNOSIS — F0781 Postconcussional syndrome: Secondary | ICD-10-CM | POA: Diagnosis present

## 2023-04-21 DIAGNOSIS — F4310 Post-traumatic stress disorder, unspecified: Secondary | ICD-10-CM | POA: Insufficient documentation

## 2023-04-21 MED ORDER — ESCITALOPRAM OXALATE 10 MG PO TABS
10.0000 mg | ORAL_TABLET | Freq: Every day | ORAL | 5 refills | Status: AC
Start: 2023-04-21 — End: ?

## 2023-04-21 NOTE — Progress Notes (Signed)
Subjective:    Patient ID: Jasmine Frye, female    DOB: 04/20/1951, 72 y.o.   MRN: [id number]  HPI  Jasmine Frye is here in follow up of her PCS. She was in the ED last month with dehydration and hyperglycemia. She says that she's testing her sugars more frequently at home. They have been running in high 100's since she's been home. Her family doctor is helping to better control those.   At last visit we increased her prazosin to help with PTSD symptoms and added lexapro for depression. She is sleeping until 5 or 6 am whereas she was only sleeping to 2-3 am. She has dreams but they aren't as vivid and she doesn't remember them now. She has worked on some strategies with Dr. Kieth Brightly for her sleep and PTSD as well. Lexapro has helped depression but she still can feel a little down at times, and she's still hesitant to go out with friends or family in fear of how she will appear to others.   Her balance is reasonable.    Pain Inventory Average Pain 5 Pain Right Now 5 My pain is constant, burning, and tingling  In the last 24 hours, has pain interfered with the following? General activity 5 Relation with others 5 Enjoyment of life 2 What TIME of day is your pain at its worst? evening and night Sleep (in general) Poor  Pain is worse with:  . Pain improves with: therapy/exercise and medication Relief from Meds: 5  Family History  Problem Relation Age of Onset   Diabetes Mother    Heart attack Mother    Cancer Father    Aneurysm Sister    Pneumonia Brother    Diabetes Sister    Pneumonia Brother    Social History   Socioeconomic History   Marital status: Married    Spouse name: Not on file   Number of children: Not on file   Years of education: Not on file   Highest education level: Not on file  Occupational History   Not on file  Tobacco Use   Smoking status: Former   Smokeless tobacco: Never  Vaping Use   Vaping status: Not on file  Substance and Sexual Activity    Alcohol use: Never   Drug use: Never   Sexual activity: Yes  Other Topics Concern   Not on file  Social History Narrative   Not on file   Social Determinants of Health   Financial Resource Strain: Not on file  Food Insecurity: Not on file  Transportation Needs: Not on file  Physical Activity: Not on file  Stress: Not on file  Social Connections: Not on file   Past Surgical History:  Procedure Laterality Date   ABDOMINAL HYSTERECTOMY     Past Surgical History:  Procedure Laterality Date   ABDOMINAL HYSTERECTOMY     Past Medical History:  Diagnosis Date   Diabetes mellitus without complication (HCC)    Hypertension    BP (!) 153/78   Pulse 66   Ht 5\' 5"  (1.651 m)   Wt 167 lb (75.8 kg)   SpO2 96%   BMI 27.79 kg/m   Opioid Risk Score:   Fall Risk Score:  `1  Depression screen PHQ 2/9     12/23/2022    1:16 PM 09/30/2022    1:32 PM 08/05/2022    9:09 AM  Depression screen PHQ 2/9  Decreased Interest 3 1 3   Down, Depressed, Hopeless 3 1 1   PHQ -  2 Score 6 2 4   Altered sleeping   2  Tired, decreased energy   1  Change in appetite   3  Feeling bad or failure about yourself    0  Trouble concentrating   1  Moving slowly or fidgety/restless   1  Suicidal thoughts   0  PHQ-9 Score   12  Difficult doing work/chores   Somewhat difficult      Review of Systems  Musculoskeletal:  Positive for neck pain.  Neurological:  Positive for dizziness.  All other systems reviewed and are negative.     Objective:   Physical Exam General: No acute distress HEENT: NCAT, EOMI, oral membranes moist Cards: reg rate  Chest: normal effort Abdomen: Soft, NT, ND Skin: dry, intact Extremities: no edema Psych: pleasant and appropriate  Skin: intact Neuro: Alert and oriented x 3. Normal insight and awareness. Intact Memory. Normal language and speech. Cognitively more appropriate today. Mild stm deficitsCranial nerve exam unremarkable. Balance appears improved    Musculoskeletal: cervical neck rom.           Assessment & Plan:  Postconcussion syndrome with ongoing vertigo, mild diplopia, PTSD sx.  -she has made nice gains!! Cervicalgia       Plan:  1.Continue with vestibular therapies at breakthrough.  -Continue with HEP  -May drive during the day time.  2. Maintain prazosin at 4mg  qhs for nightmares and PTSD.  -increase lexapro to 10 mg qhs to address anxiety/depression 3 Continue with neuropsych counseling for anxiety and PTSD symptoms with Dr. Kieth Brightly  -working on coping skills which are helping - want her to continue melatonin for sleep 10mg  nightly 4 Cervical/lumbar management per other provider 5. She is unable to return to bus driving.      20 minutes of face to face patient care time were spent during this visit. All questions were encouraged and answered. Follow up with me in 4months.

## 2023-04-21 NOTE — Patient Instructions (Signed)
INCREASE LEXAPRO TO 10MG  AT BEDTIME  TAKE MELATONIN EVERY NIGHT TO HELP WITH SLEEP ARCHITECTURE!  CHECK WITH YOUR PRIMARY ABOUT JUST USING JANUVIA WITH YOUR GLUCOTROL.   I WANT YOU TO BE  ACTIVE AND ENGAGED WITH FRIENDS AND FAMILY.

## 2023-06-10 ENCOUNTER — Encounter: Payer: Self-pay | Admitting: Psychology

## 2023-06-10 ENCOUNTER — Encounter: Payer: No Typology Code available for payment source | Attending: Psychology | Admitting: Psychology

## 2023-06-10 DIAGNOSIS — F4310 Post-traumatic stress disorder, unspecified: Secondary | ICD-10-CM | POA: Diagnosis not present

## 2023-06-10 DIAGNOSIS — G4752 REM sleep behavior disorder: Secondary | ICD-10-CM | POA: Diagnosis present

## 2023-06-10 DIAGNOSIS — S069XAS Unspecified intracranial injury with loss of consciousness status unknown, sequela: Secondary | ICD-10-CM | POA: Diagnosis not present

## 2023-06-10 DIAGNOSIS — F0670 Mild neurocognitive disorder due to known physiological condition without behavioral disturbance: Secondary | ICD-10-CM | POA: Insufficient documentation

## 2023-06-10 DIAGNOSIS — F0781 Postconcussional syndrome: Secondary | ICD-10-CM | POA: Insufficient documentation
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
## 2023-06-13 ENCOUNTER — Other Ambulatory Visit: Payer: Self-pay | Admitting: Physical Medicine & Rehabilitation

## 2023-06-13 DIAGNOSIS — F0781 Postconcussional syndrome: Secondary | ICD-10-CM

## 2023-06-13 DIAGNOSIS — F4310 Post-traumatic stress disorder, unspecified: Secondary | ICD-10-CM

## 2023-07-21 ENCOUNTER — Encounter
Payer: No Typology Code available for payment source | Attending: Physical Medicine & Rehabilitation | Admitting: Physical Medicine & Rehabilitation

## 2023-07-21 ENCOUNTER — Encounter: Payer: Self-pay | Admitting: Physical Medicine & Rehabilitation

## 2023-07-21 DIAGNOSIS — F0781 Postconcussional syndrome: Secondary | ICD-10-CM | POA: Insufficient documentation

## 2023-07-21 DIAGNOSIS — F4310 Post-traumatic stress disorder, unspecified: Secondary | ICD-10-CM | POA: Diagnosis not present

## 2023-07-21 MED ORDER — PRAZOSIN HCL 5 MG PO CAPS
5.0000 mg | ORAL_CAPSULE | Freq: Every day | ORAL | 5 refills | Status: DC
Start: 2023-07-21 — End: 2023-11-17

## 2023-07-21 NOTE — Progress Notes (Signed)
Subjective:    Patient ID: Jasmine Frye, female    DOB: 1950/11/11, 72 y.o.   MRN: [id number]  HPI  Jasmine Frye is here in follow up of her PCS. She saw Dr. Kieth Brightly last month to discuss her mood, coping strategies for sleep/anxiety, etc.   She says that her sleep has been a little better. She can sleep for about 4 hours before she wakes up. She is still having dreams about being on the school bus although she doesn't remember them. Her husband hears the dreams. She typically will wake up if she has to urinate or her husband wakes her up when she is talking out loud.  She is taking prazosin 4mg  at bedtime currently.  Her son passed away in 05-22-2023 which has only added stress for her. He had been dealing with end-stage COPD apparently. She feels that she's coping with his loss reasonably well.  From a balance standpoint she still feels a little off at time. She is not doing her vestibular exercises but she finished her formal therapy. She does do daily ecxercises for her neck/back/posture.   Pain Inventory Average Pain 6 Pain Right Now 6 My pain is constant and tingling  LOCATION OF PAIN  neck, back, right knee  BOWEL Number of stools per week: 3-4  Oral laxative use Yes  Type of laxative OTC Enema or suppository use No  History of colostomy No  Incontinent No   BLADDER Normal Difficulty starting stream Yes     Mobility how many minutes can you walk? 10-15 ability to climb steps?  yes do you drive?  yes Do you have any goals in this area?  yes  Function employed # of hrs/week Workers Comp I need assistance with the following:  dressing and shopping Do you have any goals in this area?  yes  Neuro/Psych bladder control problems weakness numbness tingling trouble walking spasms dizziness confusion depression anxiety  Prior Studies Any changes since last visit?  no  Physicians involved in your care Any changes since last visit?  no   Family History  Problem  Relation Age of Onset   Diabetes Mother    Heart attack Mother    Cancer Father    Aneurysm Sister    Pneumonia Brother    Diabetes Sister    Pneumonia Brother    Social History   Socioeconomic History   Marital status: Married    Spouse name: Not on file   Number of children: Not on file   Years of education: Not on file   Highest education level: Not on file  Occupational History   Not on file  Tobacco Use   Smoking status: Former   Smokeless tobacco: Never  Vaping Use   Vaping status: Never Used  Substance and Sexual Activity   Alcohol use: Never   Drug use: Never   Sexual activity: Yes  Other Topics Concern   Not on file  Social History Narrative   Not on file   Social Determinants of Health   Financial Resource Strain: Not on file  Food Insecurity: Not on file  Transportation Needs: Not on file  Physical Activity: Not on file  Stress: Not on file  Social Connections: Not on file   Past Surgical History:  Procedure Laterality Date   ABDOMINAL HYSTERECTOMY     Past Medical History:  Diagnosis Date   Diabetes mellitus without complication (HCC)    Hypertension    BP (!) 167/90   Pulse 69  Ht 5\' 5"  (1.651 m)   Wt 168 lb (76.2 kg)   SpO2 98%   BMI 27.96 kg/m   Opioid Risk Score:   Fall Risk Score:  `1  Depression screen [facility] 2/9     07/21/2023   11:20 AM 12/23/2022    1:16 PM 09/30/2022    1:32 PM 08/05/2022    9:09 AM  Depression screen PHQ 2/9  Decreased Interest 1 3 1 3   Down, Depressed, Hopeless 1 3 1 1   PHQ - 2 Score 2 6 2 4   Altered sleeping    2  Tired, decreased energy    1  Change in appetite    3  Feeling bad or failure about yourself     0  Trouble concentrating    1  Moving slowly or fidgety/restless    1  Suicidal thoughts    0  PHQ-9 Score    12  Difficult doing work/chores    Somewhat difficult    Review of Systems  Genitourinary:        Sometimes problem starting stream  Musculoskeletal:  Positive for back pain and  neck pain.       Right knee pain  Neurological:  Positive for weakness and numbness.       Tinging spasms  Psychiatric/Behavioral:  Positive for confusion.        Anxiety, depression  All other systems reviewed and are negative.      Objective:   Physical Exam General: No acute distress HEENT: NCAT, EOMI, oral membranes moist Cards: reg rate  Chest: normal effort Abdomen: Soft, NT, ND Skin: dry, intact Extremities: no edema Psych: pleasant and appropriate  Skin: intact Neuro: Alert and oriented x 3. Normal insight and awareness. Intact Memory. Normal language and speech. Cognitively appropriate with fair attention.. Mild stm deficitsCranial nerve exam unremarkable. Balance appears reasonable.   Musculoskeletal: cervical neck rom.           Assessment & Plan:  Postconcussion syndrome with ongoing vertigo, mild diplopia, PTSD sx.  -she has made nice gains!! Cervicalgia       Plan:  1.Continue with vestibular therapies at [facility].  -Continue with HEP. She's actually doing fairly well. Discussed that she needs to do her vestibular exercises daily.              -May drive during the day time.  2. Increase prazosin to 5mg  qhs for nightmares and PTSD. May increase to 10mg  in one week -maintain lexapro at 10 mg qhs to address anxiety/depression 3 Continue with neuropsych counseling for anxiety and PTSD symptoms with Dr. 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      20 minutes of face to face patient care time were spent during this visit. All questions were encouraged and answered. Follow up with me in 4 months.

## 2023-07-21 NOTE — Patient Instructions (Addendum)
ALWAYS FEEL FREE TO CALL OUR OFFICE WITH ANY PROBLEMS OR QUESTIONS [phone])  **PLEASE NOTE** ALL MEDICATION REFILL REQUESTS (INCLUDING CONTROLLED SUBSTANCES) NEED TO BE MADE AT LEAST 7 DAYS PRIOR TO REFILL BEING DUE. ANY REFILL REQUESTS INSIDE THAT TIME FRAME MAY RESULT IN DELAYS IN RECEIVING YOUR PRESCRIPTION.                   REMEMBER TO DO YOUR EYE/FINGER EXERCISES EVERY DAY TO HELP WITH VERTIGO.  I HAVE INCREASED YOUR PRAZOSIN (MED FOR NIGHTMARES) TO 5MG . TAKE THIS DOSE FOR A WEEK. IF NO IMPROVEMENT, INCREASE TO 10MG  (2 TABLETS).

## 2023-08-25 ENCOUNTER — Ambulatory Visit: Payer: TRICARE For Life (TFL) | Admitting: Physical Medicine & Rehabilitation

## 2023-11-17 ENCOUNTER — Encounter: Payer: Self-pay | Admitting: Physical Medicine & Rehabilitation

## 2023-11-17 ENCOUNTER — Encounter
Payer: No Typology Code available for payment source | Attending: Physical Medicine & Rehabilitation | Admitting: Physical Medicine & Rehabilitation

## 2023-11-17 DIAGNOSIS — F4310 Post-traumatic stress disorder, unspecified: Secondary | ICD-10-CM | POA: Insufficient documentation

## 2023-11-17 DIAGNOSIS — F0781 Postconcussional syndrome: Secondary | ICD-10-CM | POA: Diagnosis present

## 2023-11-17 MED ORDER — PRAZOSIN HCL 5 MG PO CAPS
10.0000 mg | ORAL_CAPSULE | Freq: Every day | ORAL | 5 refills | Status: AC
Start: 2023-11-17 — End: ?

## 2023-11-17 MED ORDER — TOPIRAMATE 25 MG PO TABS
25.0000 mg | ORAL_TABLET | Freq: Every day | ORAL | 2 refills | Status: AC
Start: 2023-11-17 — End: 2024-11-16

## 2023-11-17 NOTE — Patient Instructions (Signed)
 ALWAYS FEEL FREE TO CALL OUR OFFICE WITH ANY PROBLEMS OR QUESTIONS [phone])  **PLEASE NOTE** ALL MEDICATION REFILL REQUESTS (INCLUDING CONTROLLED SUBSTANCES) NEED TO BE MADE AT LEAST 7 DAYS PRIOR TO REFILL BEING DUE. ANY REFILL REQUESTS INSIDE THAT TIME FRAME MAY RESULT IN DELAYS IN RECEIVING YOUR PRESCRIPTION.     YOU NEED TO DO YOUR VESTIBULAR/VERTIGO EXERCISES DAILY!!!

## 2023-11-17 NOTE — Progress Notes (Signed)
 Subjective:    Patient ID: Jasmine Frye, female    DOB: [birth year], 73 y.o.   MRN: [id number]  HPI  Jasmine Frye is here in follow up of her PCS. Her nightmares are better but she still may wake up screaming or upset although now she can't remember what the dreamwas about. She goes to bed around 10-11pm and may wake up around 0200. She is taking 10mg  prazosin. Her husband notices sx better also.   She is experiencing vertigo-like dizziness every 2-3 weeks, episodes lasting 15-20 minutes. She usually just rests and closes her eyes until the sx pass. She is not doing her acclimation exercises routinely.   Pain Inventory Average Pain 8 Pain Right Now 7 My pain is sharp, burning, and aching  In the last 24 hours, has pain interfered with the following? General activity 6 Relation with others 10 Enjoyment of life 0 What TIME of day is your pain at its worst? varies Sleep (in general) Fair  Pain is worse with: unsure Pain improves with: medication and massager machine and voltaren gel Relief from Meds: 8  Family History  Problem Relation Age of Onset   Diabetes Mother    Heart attack Mother    Cancer Father    Aneurysm Sister    Pneumonia Brother    Diabetes Sister    Pneumonia Brother    Social History   Socioeconomic History   Marital status: Married    Spouse name: Not on file   Number of children: Not on file   Years of education: Not on file   Highest education level: Not on file  Occupational History   Not on file  Tobacco Use   Smoking status: Former   Smokeless tobacco: Never  Vaping Use   Vaping status: Never Used  Substance and Sexual Activity   Alcohol use: Never   Drug use: Never   Sexual activity: Yes  Other Topics Concern   Not on file  Social History Narrative   Not on file   Social Drivers of Health   Financial Resource Strain: Not on file  Food Insecurity: Not on file  Transportation Needs: Not on file  Physical Activity: Not on file  Stress:  Not on file  Social Connections: Not on file   Past Surgical History:  Procedure Laterality Date   ABDOMINAL HYSTERECTOMY     Past Surgical History:  Procedure Laterality Date   ABDOMINAL HYSTERECTOMY     Past Medical History:  Diagnosis Date   Diabetes mellitus without complication (HCC)    Hypertension    BP (!) 177/84   Pulse 69   Ht 5\' 5"  (1.651 m)   Wt 170 lb (77.1 kg)   SpO2 100%   BMI 28.29 kg/m   Opioid Risk Score:   Fall Risk Score:  `1  Depression screen PHQ 2/9     07/21/2023   11:20 AM 12/23/2022    1:16 PM 09/30/2022    1:32 PM 08/05/2022    9:09 AM  Depression screen PHQ 2/9  Decreased Interest 1 3 1 3   Down, Depressed, Hopeless 1 3 1 1   PHQ - 2 Score 2 6 2 4   Altered sleeping    2  Tired, decreased energy    1  Change in appetite    3  Feeling bad or failure about yourself     0  Trouble concentrating    1  Moving slowly or fidgety/restless    1  Suicidal thoughts  0  PHQ-9 Score    12  Difficult doing work/chores    Somewhat difficult     Review of Systems  Musculoskeletal:  Positive for neck pain.       Pain starts at neck and goes up the right side of head  Neurological:  Positive for dizziness.  All other systems reviewed and are negative.      Objective:   Physical Exam General: No acute distress HEENT: NCAT, EOMI, oral membranes moist Cards: reg rate  Chest: normal effort Abdomen: Soft, NT, ND Skin: dry, intact Extremities: no edema Psych: pleasant and appropriate  Skin: intact Neuro: Alert and oriented x 3. Normal insight and awareness. Fxnl Memory. Normal language and speech. Cognitively appropriate with fair attention.. Mild stm deficitsCranial nerve exam unremarkable. Balance appears reasonable.   Musculoskeletal: cervical neck fxnl. Tends to limit herself slightly with rotation          Assessment & Plan:  Postconcussion syndrome with ongoing vertigo, mild diplopia, PTSD sx.  -she has made nice  gains!! Cervicalgia       Plan:  1.Continue with [facility]ibular therapies at [facility].  -again needs to do vestibular exercises daily, 15"              -May drive during the day time.  2. Continue prazosin at 10mg  daily -continue lexapro at 10 mg qhs to address anxiety/depression -begin trial of low dose topamax 25mg  at bedtime for sleep/PTSD sx -stop gabapentin (she was only using occasionally) 3 Continue with neuropsych counseling for anxiety and PTSD symptoms with Dr. Kieth Brightly. 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      20 minutes of face to face patient care time were spent during this visit. All questions were encouraged and answered. Follow up with me in 4 months.

## 2024-03-15 ENCOUNTER — Encounter: Payer: Self-pay | Admitting: Physical Medicine & Rehabilitation

## 2024-03-15 ENCOUNTER — Encounter: Attending: Physical Medicine & Rehabilitation | Admitting: Physical Medicine & Rehabilitation

## 2024-03-15 VITALS — BP 126/76 | HR 84 | Ht 65.0 in | Wt 174.8 lb

## 2024-03-15 DIAGNOSIS — H8110 Benign paroxysmal vertigo, unspecified ear: Secondary | ICD-10-CM | POA: Insufficient documentation

## 2024-03-15 DIAGNOSIS — F0670 Mild neurocognitive disorder due to known physiological condition without behavioral disturbance: Secondary | ICD-10-CM | POA: Diagnosis present

## 2024-03-15 DIAGNOSIS — G4752 REM sleep behavior disorder: Secondary | ICD-10-CM | POA: Insufficient documentation

## 2024-03-15 DIAGNOSIS — F4310 Post-traumatic stress disorder, unspecified: Secondary | ICD-10-CM | POA: Insufficient documentation

## 2024-03-15 DIAGNOSIS — F0781 Postconcussional syndrome: Secondary | ICD-10-CM | POA: Diagnosis not present

## 2024-03-15 DIAGNOSIS — S069XAS Unspecified intracranial injury with loss of consciousness status unknown, sequela: Secondary | ICD-10-CM | POA: Insufficient documentation

## 2024-03-15 NOTE — Progress Notes (Signed)
 Subjective:    Patient ID: Jasmine Frye, female    DOB: [birth year], 73 y.o.   MRN: [id number]  HPI  Jasmine Frye is here in follow up of her PCS. She tells me that she has had episodes of vertigo at times, the worst of which was when she was in a car with her husband. She occasionally has an episode when she first stands up. Overall they're still much improved.   From a sleep standpoint she has been doing fairly well. It sometimes is hard for her to fall asleep, partly due to her episode in the car wit her husband which startled her. She will use meclizine  sometimes to help fall asleep. She also put her phone in a different room. She listens to books on tape, too.  Nightmares are better with prazosin . Topamax  also has helped with sleep and headaches.   Mood is positive. She remains on lexapro  and remeron.     Pain Inventory Average Pain 6 Pain Right Now 5 My pain is stabbing  In the last 24 hours, has pain interfered with the following? General activity 7 Relation with others 6 Enjoyment of life 9 What TIME of day is your pain at its worst? varies Sleep (in general) Fair  Pain is worse with: walking, bending, sitting, inactivity, standing, and some activites Pain improves with: rest Relief from Meds: 4  Family History  Problem Relation Age of Onset   Diabetes Mother    Heart attack Mother    Cancer Father    Aneurysm Sister    Pneumonia Brother    Diabetes Sister    Pneumonia Brother    Social History   Socioeconomic History   Marital status: Married    Spouse name: Not on file   Number of children: Not on file   Years of education: Not on file   Highest education level: Not on file  Occupational History   Not on file  Tobacco Use   Smoking status: Former   Smokeless tobacco: Never  Vaping Use   Vaping status: Never Used  Substance and Sexual Activity   Alcohol use: Never   Drug use: Never   Sexual activity: Yes  Other Topics Concern   Not on file  Social  History Narrative   Not on file   Social Drivers of Health   Financial Resource Strain: Not on file  Food Insecurity: Not on file  Transportation Needs: Not on file  Physical Activity: Not on file  Stress: Not on file  Social Connections: Not on file   Past Surgical History:  Procedure Laterality Date   ABDOMINAL HYSTERECTOMY     Past Surgical History:  Procedure Laterality Date   ABDOMINAL HYSTERECTOMY     Past Medical History:  Diagnosis Date   Diabetes mellitus without complication (HCC)    Hypertension    BP (!) 147/67 (BP Location: Left Arm, Patient Position: Sitting, Cuff Size: Large)   Pulse 84   Ht 5' 5 (1.651 m)   Wt 174 lb 12.8 oz (79.3 kg)   SpO2 95%   BMI 29.09 kg/m   126/76 (second BP reading)  Opioid Risk Score:   Fall Risk Score:  `1  Depression screen [facility] 2/9     03/15/2024   10:32 AM 07/21/2023   11:20 AM 12/23/2022    1:16 PM 09/30/2022    1:32 PM 08/05/2022    9:09 AM  Depression screen PHQ 2/9  Decreased Interest 1 1 3 1 3   Down,  Depressed, Hopeless 1 1 3 1 1   PHQ - 2 Score 2 2 6 2 4   Altered sleeping 1    2  Tired, decreased energy 1    1  Change in appetite 0    3  Feeling bad or failure about yourself      0  Trouble concentrating 1    1  Moving slowly or fidgety/restless 1    1  Suicidal thoughts 0    0  PHQ-9 Score 6    12  Difficult doing work/chores Somewhat difficult    Somewhat difficult      Review of Systems  Musculoskeletal:  Positive for back pain, myalgias and neck pain.  Neurological:  Positive for dizziness, weakness and light-headedness.       Patient reports having weakness and dizziness.  Patient reports loss of balance often and unstable on feet at times  All other systems reviewed and are negative.      Objective:   Physical Exam General: No acute distress HEENT: NCAT, EOMI, oral membranes moist Cards: reg rate  Chest: normal effort Abdomen: Soft, NT, ND Skin: dry, intact Extremities: no edema Psych:  pleasant and appropriate. Very bright  Skin: intact Neuro: Alert and oriented x 3. Normal insight and awareness. Fxnl Memory. Normal language and speech. Cognitively appropriate with fair attention.. Improved  stm deficitsCranial nerve exam unremarkable. Balance appears reasonable.   Musculoskeletal: cervical neck fxnl. Improved rom today         Assessment & Plan:  Postconcussion syndrome with ongoing vertigo, mild diplopia, PTSD sx.  -she has made nice gains!! Cervicalgia       Plan:  1.Continue with vestibular therapies at [facility].  -again needs to do vestibular exercises daily to twice daily -encouraged adequate fluids, heat avoidance, proper sleep             -May drive during the day time locally 2. Continue prazosin  at 10mg  daily -continue lexapro  at 10 mg qhs to address anxiety/depression -continue low dose topamax  25mg  at bedtime for sleep/PTSD sx and headaches--this has helped -may use meclizine  to help relax at night so she can fall asleep.  3 Continue with neuropsych counseling for anxiety and PTSD symptoms with Dr. Corina.   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      20 minutes of face to face patient care time were spent during this visit. All questions were encouraged and answered. Follow up with me in 6 months.

## 2024-03-15 NOTE — Patient Instructions (Addendum)
 ALWAYS FEEL FREE TO CALL OUR OFFICE WITH ANY PROBLEMS OR QUESTIONS [phone])  **PLEASE NOTE** ALL MEDICATION REFILL REQUESTS (INCLUDING CONTROLLED SUBSTANCES) NEED TO BE MADE AT LEAST 7 DAYS PRIOR TO REFILL BEING DUE. ANY REFILL REQUESTS INSIDE THAT TIME FRAME MAY RESULT IN DELAYS IN RECEIVING YOUR PRESCRIPTION.    WORK ON MINDFULNESS AND MEDITATION TECHNIQUES BEFORE YOU GO TO BED. READ A BOOK, HAVE BACKGROUND SOUNDS.    YOU NEED TO BE DOING YOUR VERTIGO EXERCISES AT LEAST ONCE PER DAY, PREFERABLY TWICE DAILY AT LEAST FOR A FEW MORE WEEKS. IF SYMPTOMS ARE BETTER, YOU CAN BACK OFF A LITTLE BIT.

## 2024-03-16 ENCOUNTER — Encounter: Payer: Self-pay | Admitting: Psychology

## 2024-03-16 ENCOUNTER — Encounter: Payer: TRICARE For Life (TFL) | Admitting: Psychology

## 2024-03-16 DIAGNOSIS — F0670 Mild neurocognitive disorder due to known physiological condition without behavioral disturbance: Secondary | ICD-10-CM

## 2024-03-16 DIAGNOSIS — S069XAS Unspecified intracranial injury with loss of consciousness status unknown, sequela: Secondary | ICD-10-CM

## 2024-03-16 DIAGNOSIS — H8110 Benign paroxysmal vertigo, unspecified ear: Secondary | ICD-10-CM | POA: Diagnosis not present

## 2024-03-16 DIAGNOSIS — G4752 REM sleep behavior disorder: Secondary | ICD-10-CM | POA: Diagnosis not present

## 2024-03-16 DIAGNOSIS — F4310 Post-traumatic stress disorder, unspecified: Secondary | ICD-10-CM | POA: Diagnosis not present
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
## 2024-05-17 ENCOUNTER — Encounter: Payer: TRICARE For Life (TFL) | Attending: Psychology | Admitting: Psychology

## 2024-05-17 ENCOUNTER — Encounter: Payer: Self-pay | Admitting: Psychology

## 2024-05-17 DIAGNOSIS — F0670 Mild neurocognitive disorder due to known physiological condition without behavioral disturbance: Secondary | ICD-10-CM | POA: Diagnosis present

## 2024-05-17 DIAGNOSIS — S069XAS Unspecified intracranial injury with loss of consciousness status unknown, sequela: Secondary | ICD-10-CM | POA: Insufficient documentation

## 2024-05-17 DIAGNOSIS — G4752 REM sleep behavior disorder: Secondary | ICD-10-CM | POA: Diagnosis not present

## 2024-05-17 DIAGNOSIS — H8110 Benign paroxysmal vertigo, unspecified ear: Secondary | ICD-10-CM | POA: Insufficient documentation

## 2024-05-17 DIAGNOSIS — F4310 Post-traumatic stress disorder, unspecified: Secondary | ICD-10-CM | POA: Insufficient documentation

## 2024-05-17 DIAGNOSIS — F0781 Postconcussional syndrome: Secondary | ICD-10-CM | POA: Diagnosis present
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
## 2024-07-18 ENCOUNTER — Encounter: Payer: TRICARE For Life (TFL) | Admitting: Psychology

## 2024-08-17 ENCOUNTER — Encounter: Admitting: Psychology

## 2024-08-17 DIAGNOSIS — F0670 Mild neurocognitive disorder due to known physiological condition without behavioral disturbance: Secondary | ICD-10-CM | POA: Diagnosis present

## 2024-08-17 DIAGNOSIS — G4752 REM sleep behavior disorder: Secondary | ICD-10-CM | POA: Diagnosis present

## 2024-08-17 DIAGNOSIS — F0781 Postconcussional syndrome: Secondary | ICD-10-CM

## 2024-08-17 DIAGNOSIS — S069XAS Unspecified intracranial injury with loss of consciousness status unknown, sequela: Secondary | ICD-10-CM

## 2024-08-17 DIAGNOSIS — F4310 Post-traumatic stress disorder, unspecified: Secondary | ICD-10-CM

## 2024-08-21 ENCOUNTER — Encounter: Payer: Self-pay | Admitting: Psychology
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
## 2024-09-13 ENCOUNTER — Encounter: Attending: Physical Medicine & Rehabilitation | Admitting: Physical Medicine & Rehabilitation

## 2024-09-18 ENCOUNTER — Ambulatory Visit: Admitting: Psychology

## 2024-10-04 ENCOUNTER — Encounter: Attending: Psychology | Admitting: Psychology

## 2024-10-04 ENCOUNTER — Encounter: Payer: Self-pay | Admitting: Psychology

## 2024-10-04 DIAGNOSIS — F0670 Mild neurocognitive disorder due to known physiological condition without behavioral disturbance: Secondary | ICD-10-CM | POA: Diagnosis present

## 2024-10-04 DIAGNOSIS — S069XAS Unspecified intracranial injury with loss of consciousness status unknown, sequela: Secondary | ICD-10-CM | POA: Insufficient documentation

## 2024-10-04 DIAGNOSIS — G4752 REM sleep behavior disorder: Secondary | ICD-10-CM | POA: Insufficient documentation

## 2024-10-04 DIAGNOSIS — F4310 Post-traumatic stress disorder, unspecified: Secondary | ICD-10-CM | POA: Diagnosis present
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
## 2024-12-12 ENCOUNTER — Encounter: Admitting: Psychology
